# Patient Record
Sex: Female | Born: 1990 | Race: Black or African American | Hispanic: No | Smoking: Never smoker
Health system: Southern US, Community
[De-identification: ages and names within clinical notes are randomized; demographics above are authoritative.]

## PROBLEM LIST (undated history)

## (undated) DIAGNOSIS — F419 Anxiety disorder, unspecified: Secondary | ICD-10-CM

## (undated) DIAGNOSIS — Z789 Other specified health status: Secondary | ICD-10-CM

## (undated) DIAGNOSIS — O039 Complete or unspecified spontaneous abortion without complication: Secondary | ICD-10-CM

## (undated) HISTORY — DX: Other specified health status: Z78.9

## (undated) HISTORY — PX: NO PAST SURGERIES: SHX2092

## (undated) HISTORY — PX: OTHER SURGICAL HISTORY: SHX169

## (undated) HISTORY — DX: Complete or unspecified spontaneous abortion without complication: O03.9

## (undated) HISTORY — PX: DILATION AND CURETTAGE OF UTERUS: SHX78

---

## 2017-12-16 ENCOUNTER — Emergency Department
Admission: EM | Admit: 2017-12-16 | Discharge: 2017-12-16 | Disposition: A | Discharge status: Home / Self Care | Payer: Self-pay | Attending: Student in an Organized Health Care Education/Training Program | Admitting: Student in an Organized Health Care Education/Training Program

## 2017-12-16 ENCOUNTER — Encounter: Payer: Self-pay | Admitting: Emergency Medicine

## 2017-12-16 ENCOUNTER — Other Ambulatory Visit: Payer: Self-pay

## 2017-12-16 DIAGNOSIS — R519 Headache, unspecified: Secondary | ICD-10-CM

## 2017-12-16 DIAGNOSIS — R51 Headache: Secondary | ICD-10-CM | POA: Insufficient documentation

## 2017-12-16 LAB — CBC
HCT: 42.7 % (ref 35.0–47.0)
Hemoglobin: 13.9 g/dL (ref 12.0–16.0)
MCH: 28 pg (ref 26.0–34.0)
MCHC: 32.6 g/dL (ref 32.0–36.0)
MCV: 86 fL (ref 80.0–100.0)
PLATELETS: 282 10*3/uL (ref 150–440)
RBC: 4.96 MIL/uL (ref 3.80–5.20)
RDW: 13.6 % (ref 11.5–14.5)
WBC: 7.4 10*3/uL (ref 3.6–11.0)

## 2017-12-16 LAB — BASIC METABOLIC PANEL
Anion gap: 6 (ref 5–15)
BUN: 14 mg/dL (ref 6–20)
CALCIUM: 9.1 mg/dL (ref 8.9–10.3)
CO2: 27 mmol/L (ref 22–32)
CREATININE: 0.6 mg/dL (ref 0.44–1.00)
Chloride: 105 mmol/L (ref 98–111)
Glucose, Bld: 95 mg/dL (ref 70–99)
Potassium: 3.6 mmol/L (ref 3.5–5.1)
SODIUM: 138 mmol/L (ref 135–145)

## 2017-12-16 LAB — URINALYSIS, COMPLETE (UACMP) WITH MICROSCOPIC
Bacteria, UA: NONE SEEN
Bilirubin Urine: NEGATIVE
GLUCOSE, UA: NEGATIVE mg/dL
HGB URINE DIPSTICK: NEGATIVE
Ketones, ur: NEGATIVE mg/dL
Leukocytes, UA: NEGATIVE
NITRITE: NEGATIVE
Protein, ur: NEGATIVE mg/dL
SPECIFIC GRAVITY, URINE: 1.028 (ref 1.005–1.030)
pH: 6 (ref 5.0–8.0)

## 2017-12-16 LAB — POC URINE PREG, ED: Preg Test, Ur: NEGATIVE

## 2017-12-16 MED ORDER — PROCHLORPERAZINE EDISYLATE 10 MG/2ML IJ SOLN
10.0000 mg | Freq: Once | INTRAMUSCULAR | Status: AC
  Administered 2017-12-16: 10 mg via INTRAVENOUS
  Filled 2017-12-16: qty 2

## 2017-12-16 MED ORDER — DIPHENHYDRAMINE HCL 50 MG/ML IJ SOLN
12.5000 mg | Freq: Once | INTRAMUSCULAR | Status: AC
  Administered 2017-12-16: 12.5 mg via INTRAVENOUS
  Filled 2017-12-16: qty 1

## 2017-12-16 MED ORDER — SODIUM CHLORIDE 0.9 % IV BOLUS
1000.0000 mL | Freq: Once | INTRAVENOUS | Status: AC
  Administered 2017-12-16: 1000 mL via INTRAVENOUS

## 2017-12-16 NOTE — ED Triage Notes (Signed)
Pt states bad headache with some vomiting over the past few days, appears in NAD. Hx of migraines when she was younger.

## 2017-12-16 NOTE — Discharge Instructions (Signed)

## 2017-12-16 NOTE — ED Provider Notes (Signed)
[facility] Emergency Department Provider Note    First MD Initiated Contact with Patient 12/16/17 1519     (approximate)  I have reviewed the triage vital signs and the nursing notes.   HISTORY  Chief Complaint Headache    HPI Diana Kirby is a 27 y.o. female with no significant past medical history presents the ER with chief complaint of mild to moderate frontal headache.  Denies any previous history of headaches.  Denies any fevers.  No numbness or tingling.  Bright lights make the headache worse.  She is having difficulty sleeping at night due to persistent headache.  She is tried Motrin and Tylenol without any improvement.  States that she has had some associated nausea and feels dehydrated.  Is uncertain of pregnancy last menstrual cycle was the 10th of last month.  Denies any dysuria.  No chest pain.  No shortness of breath.    History reviewed. No pertinent past medical history. No family history on file. History reviewed. No pertinent surgical history. There are no active problems to display for this patient.     Prior to Admission medications   Not on File    Allergies Penicillins    Social History Social History   Tobacco Use  . Smoking status: Never Smoker  . Smokeless tobacco: Never Used  Substance Use Topics  . Alcohol use: Not Currently  . Drug use: Not on file    Review of Systems Patient denies headaches, rhinorrhea, blurry vision, numbness, shortness of breath, chest pain, edema, cough, abdominal pain, nausea, vomiting, diarrhea, dysuria, fevers, rashes or hallucinations unless otherwise stated above in HPI. ____________________________________________   PHYSICAL EXAM:  VITAL SIGNS: Vitals:   12/16/17 1440 12/16/17 1743  BP: 116/68 106/68  Pulse: 81 71  Resp: 16 18  Temp: 98.8 F (37.1 C)   SpO2: 99% 99%    Constitutional: Alert and oriented.  Eyes: Conjunctivae are normal.  Head: Atraumatic. Nose: No  congestion/rhinnorhea. Mouth/Throat: Mucous membranes are moist.   Neck: No stridor. Painless ROM.  Cardiovascular: Normal rate, regular rhythm. Grossly normal heart sounds.  Good peripheral circulation. Respiratory: Normal respiratory effort.  No retractions. Lungs CTAB. Gastrointestinal: Soft and nontender. No distention. No abdominal bruits. No CVA tenderness. Genitourinary:  Musculoskeletal: No lower extremity tenderness nor edema.  No joint effusions. Neurologic:  CN- intact.  No facial droop, Normal FNF.  Normal heel to shin.  Sensation intact bilaterally. Normal speech and language. No gross focal neurologic deficits are appreciated. No gait instability.  Skin:  Skin is warm, dry and intact. No rash noted. Psychiatric: Mood and affect are normal. Speech and behavior are normal.  ____________________________________________   LABS (all labs ordered are listed, but only abnormal results are displayed)  Results for orders placed or performed during the hospital encounter of 12/16/17 (from the past 24 hour(s))  CBC     Status: None   Collection Time: 12/16/17  2:45 PM  Result Value Ref Range   WBC 7.4 3.6 - 11.0 K/uL   RBC 4.96 3.80 - 5.20 MIL/uL   Hemoglobin 13.9 12.0 - 16.0 g/dL   HCT 54.6 56.8 - 12.7 %   MCV 86.0 80.0 - 100.0 fL   MCH 28.0 26.0 - 34.0 pg   MCHC 32.6 32.0 - 36.0 g/dL   RDW 51.7 00.1 - 74.9 %   Platelets 282 150 - 440 K/uL  Basic metabolic panel     Status: None   Collection Time: 12/16/17  2:45 PM  Result Value Ref Range   Sodium 138 135 - 145 mmol/L   Potassium 3.6 3.5 - 5.1 mmol/L   Chloride 105 98 - 111 mmol/L   CO2 27 22 - 32 mmol/L   Glucose, Bld 95 70 - 99 mg/dL   BUN 14 6 - 20 mg/dL   Creatinine, Ser 4.09 0.44 - 1.00 mg/dL   Calcium 9.1 8.9 - 81.1 mg/dL   GFR calc non Af Amer >60 >60 mL/min   GFR calc Af Amer >60 >60 mL/min   Anion gap 6 5 - 15  POC Urine Pregnancy, ED     Status: None   Collection Time: 12/16/17  5:15 PM  Result Value Ref  Range   Preg Test, Ur Negative Negative   ____________________________________________ ___________________________________  RADIOLOGY   ____________________________________________   PROCEDURES  Procedure(s) performed:  Procedures    Critical Care performed: no ____________________________________________   INITIAL IMPRESSION / ASSESSMENT AND PLAN / ED COURSE  Pertinent labs & imaging results that were available during my care of the patient were reviewed by me and considered in my medical decision making (see chart for details).   DDX: Migraine, tension, cluster, dehydration, SAH, meningitis  Diana Kirby is a 27 y.o. who presents to the ED with with HA for last several days. Not worst HA ever. Gradual onset.  Denies focal neurologic symptoms. Denies trauma. + photophobia. No fevers or neck pain. No vision loss. Afebrile in ED. VSS. Exam as above. No meningeal signs. No CN, motor, sensory or cerebellar deficits. Temporal arteries palpable and non-tender. Appears well and non-toxic.  Will provide IV fluids for hydration and IV medications for symptom control. Likely tension, non-specific or possible migraine HA. Clinical picture is not consistent with ICH, SAH, SDH, EDH, TIA, or CVA. No concern for meningitis or encephalitis. No concern for GCA/Temporal arteritis.     Clinical Course as of Dec 17 1746  [facility] Dec 16, 2017  1735 Patient reassessed.  Feels much improved.  Is requesting discharge home.  Urinalysis not returned at this point but denies any symptoms of UTI.  As she has reassuring neuro exam with a steady gait I do believe she stable and appropriate for outpatient follow-up.   [PR]    Clinical Course User Index [PR] Willy Eddy, MD     As part of my medical decision making, I reviewed the following data within the electronic MEDICAL RECORD NUMBER Nursing notes reviewed and incorporated, Labs reviewed, notes from prior ED  visits.   ____________________________________________   FINAL CLINICAL IMPRESSION(S) / ED DIAGNOSES  Final diagnoses:  Bad headache      NEW MEDICATIONS STARTED DURING THIS VISIT:  There are no discharge medications for this patient.    Note:  This document was prepared using Dragon voice recognition software and may include unintentional dictation errors.    Willy Eddy, MD 12/16/17 [phone]

## 2018-08-07 ENCOUNTER — Encounter: Payer: Self-pay | Admitting: Emergency Medicine

## 2018-08-07 ENCOUNTER — Emergency Department
Admission: EM | Admit: 2018-08-07 | Discharge: 2018-08-07 | Disposition: A | Discharge status: Home / Self Care | Payer: Self-pay | Attending: Emergency Medicine | Admitting: Emergency Medicine

## 2018-08-07 ENCOUNTER — Emergency Department: Payer: Self-pay

## 2018-08-07 ENCOUNTER — Other Ambulatory Visit: Payer: Self-pay

## 2018-08-07 DIAGNOSIS — R519 Headache, unspecified: Secondary | ICD-10-CM

## 2018-08-07 DIAGNOSIS — R51 Headache: Secondary | ICD-10-CM | POA: Insufficient documentation

## 2018-08-07 LAB — POCT PREGNANCY, URINE: Preg Test, Ur: NEGATIVE

## 2018-08-07 MED ORDER — DIPHENHYDRAMINE HCL 50 MG/ML IJ SOLN
50.0000 mg | Freq: Once | INTRAMUSCULAR | Status: AC
  Administered 2018-08-07: 50 mg via INTRAVENOUS
  Filled 2018-08-07: qty 1

## 2018-08-07 MED ORDER — FLUTICASONE PROPIONATE 50 MCG/ACT NA SUSP: 1.0000 | Freq: Two times a day (BID) | NASAL | 0 refills | Status: DC

## 2018-08-07 MED ORDER — BUTALBITAL-APAP-CAFFEINE 50-325-40 MG PO TABS: 1.0000 | ORAL_TABLET | Freq: Four times a day (QID) | ORAL | 0 refills | Status: DC | PRN

## 2018-08-07 MED ORDER — KETOROLAC TROMETHAMINE 30 MG/ML IJ SOLN
30.0000 mg | Freq: Once | INTRAMUSCULAR | Status: AC
  Administered 2018-08-07: 30 mg via INTRAVENOUS
  Filled 2018-08-07: qty 1

## 2018-08-07 MED ORDER — PROMETHAZINE HCL 25 MG/ML IJ SOLN
25.0000 mg | Freq: Once | INTRAMUSCULAR | Status: AC
  Administered 2018-08-07: 25 mg via INTRAVENOUS
  Filled 2018-08-07: qty 1

## 2018-08-07 NOTE — ED Provider Notes (Signed)
[facility] Emergency Dept Emergency Department Provider Note  ____________________________________________  Time seen: Approximately 4:49 PM  I have reviewed the triage vital signs and the nursing notes.   HISTORY  Chief Complaint Migraine    HPI Diana Kirby is a 28 y.o. female who presents the emergency department complaining of migraine x1 week.  Patient reports that she is experiencing a global headache.  She denies any trauma precipitating this.  She reports that it started off as a mild headache but has increased throughout the week.  She is now experiencing nausea as well.  No emesis.  Patient denies any URI symptoms.  Patient does have a history of recurrent headaches as a teenager but states that for the past couple years she only has a sporadic headache once in a while.  She is concerned as her migraine is never lasted a week.  She denies any auras.  No fevers or chills, chest pain, shortness of breath, abdominal pain, diarrhea or constipation.  Patient reports that she has a couple days late on her menstrual cycle.         History reviewed. No pertinent past medical history.  There are no active problems to display for this patient.   History reviewed. No pertinent surgical history.  Prior to Admission medications   Medication Sig Start Date End Date Taking? Authorizing Provider  butalbital-acetaminophen-caffeine (FIORICET) 50-325-40 MG tablet Take 1-2 tablets by mouth every 6 (six) hours as needed for headache. 08/07/18 08/07/19  , Delorise Royals, PA-C  fluticasone (FLONASE) 50 MCG/ACT nasal spray Place 1 spray into both nostrils 2 (two) times daily. 08/07/18   , Delorise Royals, PA-C    Allergies Penicillins  No family history on file.  Social History Social History   Tobacco Use  . Smoking status: Never Smoker  . Smokeless tobacco: Never Used  Substance Use Topics  . Alcohol use: Not Currently  . Drug use: Not on file     Review of  Systems  Constitutional: No fever/chills Eyes: No visual changes. No discharge ENT: No upper respiratory complaints. Cardiovascular: no chest pain. Respiratory: no cough. No SOB. Gastrointestinal: No abdominal pain.  Positive nausea, no vomiting.  No diarrhea.  No constipation. Genitourinary: No vaginal bleeding or discharge.  No dysuria, polyuria, hematuria.  Patient is several days late for her menstrual cycle. Musculoskeletal: Negative for musculoskeletal pain. Skin: Negative for rash, abrasions, lacerations, ecchymosis. Neurological: Positive for headache but denies focal weakness or numbness. 10-point ROS otherwise negative.  ____________________________________________   PHYSICAL EXAM:  VITAL SIGNS: ED Triage Vitals  Enc Vitals Group     BP 08/07/18 1639 120/72     Pulse Rate 08/07/18 1639 69     Resp 08/07/18 1639 18     Temp 08/07/18 1639 98.2 F (36.8 C)     Temp Source 08/07/18 1639 Oral     SpO2 08/07/18 1639 100 %     Weight 08/07/18 1635 170 lb (77.1 kg)     Height 08/07/18 1635  (1.6 m)     Head Circumference --      Peak Flow --      Pain Score 08/07/18 1635 10     Pain Loc --      Pain Edu? --      Excl. in GC? --      Constitutional: Alert and oriented. Well appearing and in no acute distress. Eyes: Conjunctivae are normal. PERRL. EOMI. Head: Atraumatic. ENT:      Ears: EACs unremarkable  bilaterally.  TMs are minimally bulging bilaterally.      Nose: No congestion/rhinnorhea.  Turbinates are boggy in nature.  Patient is tender to percussion over the frontal sinuses.  No tenderness to percussion over the sinuses.      Mouth/Throat: Mucous membranes are moist.  Oropharynx is nonerythematous and nonedematous.  Uvula is midline. Neck: No stridor.  No cervical spine tenderness to palpation.  Neck is supple full range of motion  Cardiovascular: Normal rate, regular rhythm. Normal S1 and S2.  Good peripheral circulation. Respiratory: Normal respiratory  effort without tachypnea or retractions. Lungs CTAB. Good air entry to the bases with no decreased or absent breath sounds. Gastrointestinal: Bowel sounds 4 quadrants. Soft and nontender to palpation. No guarding or rigidity. No palpable masses. No distention. No CVA tenderness. Musculoskeletal: Full range of motion to all extremities. No gross deformities appreciated. Neurologic:  Normal speech and language. No gross focal neurologic deficits are appreciated.  Cranial nerves II through XII grossly intact.  Negative Romberg's and pronator drift. Skin:  Skin is warm, dry and intact. No rash noted. Psychiatric: Mood and affect are normal. Speech and behavior are normal. Patient exhibits appropriate insight and judgement.   ____________________________________________   LABS (all labs ordered are listed, but only abnormal results are displayed)  Labs Reviewed  POC URINE PREG, ED  POCT PREGNANCY, URINE   ____________________________________________  EKG   ____________________________________________  RADIOLOGY I personally viewed and evaluated these images as part of my medical decision making, as well as reviewing the written report by the radiologist.  Ct Head Wo Contrast  Result Date: 08/07/2018 CLINICAL DATA:  1 week history of migraine headache. EXAM: CT HEAD WITHOUT CONTRAST TECHNIQUE: Contiguous axial images were obtained from the base of the skull through the vertex without intravenous contrast. COMPARISON:  None. FINDINGS: Brain: There is no evidence for acute hemorrhage, hydrocephalus, mass lesion, or abnormal extra-axial fluid collection. No definite CT evidence for acute infarction. Vascular: No hyperdense vessel or unexpected calcification. Skull: No evidence for fracture. No worrisome lytic or sclerotic lesion. Sinuses/Orbits: The visualized paranasal sinuses and mastoid air cells are clear. Visualized portions of the globes and intraorbital fat are unremarkable. Other: None.  IMPRESSION: Normal exam. Electronically Signed   By: Kennith CenterEric  Mansell M.D.   On: 08/07/2018 17:31    ____________________________________________    PROCEDURES  Procedure(s) performed:    Procedures    Medications  promethazine (PHENERGAN) injection 25 mg (25 mg Intravenous Given 08/07/18 1751)  ketorolac (TORADOL) 30 MG/ML injection 30 mg (30 mg Intravenous Given 08/07/18 1749)  diphenhydrAMINE (BENADRYL) injection 50 mg (50 mg Intravenous Given 08/07/18 1749)     ____________________________________________   INITIAL IMPRESSION / ASSESSMENT AND PLAN / ED COURSE  Pertinent labs & imaging results that were available during my care of the patient were reviewed by me and considered in my medical decision making (see chart for details).  Review of the [place] CSRS was performed in accordance of the [facility] prior to dispensing any controlled drugs.  Clinical Course as of Aug 06 1812  Thu Aug 07, 2018  1712 Patient presents emergency department complaining of 7 days of worsening headache.  Patient reports that she had a history of headaches as a teenager but has had none recently.  Patient reports that this headache is different from any other headache she is ever experienced.  This is both more severe, accompanied with nausea, photophobia and phonophobia.  Patient did have some mild boggy turbinates and tender percussion as  well as several days late on her menstrual cycle.  Differential includes migraine headache, sinus headache, pregnancy, cranial mass, meningitis.   [JC]    Clinical Course User Index [JC] , Delorise Royals, PA-C          Patient's diagnosis is consistent with bad headache.  Patient presents the emergency department complaining of a headache x7 days.  Overall exam is reassuring.  Patient did have some mild findings consistent with allergic rhinitis.  Patient is given migraine cocktail emergency department, Flonase and Fioricet for at home use if headache returns.   Follow-up primary care..Patient is given ED precautions to return to the ED for any worsening or new symptoms.     ____________________________________________  FINAL CLINICAL IMPRESSION(S) / ED DIAGNOSES  Final diagnoses:  Bad headache      NEW MEDICATIONS STARTED DURING THIS VISIT:  ED Discharge Orders         Ordered    fluticasone (FLONASE) 50 MCG/ACT nasal spray  2 times daily     08/07/18 1748    butalbital-acetaminophen-caffeine (FIORICET) 50-325-40 MG tablet  Every 6 hours PRN     08/07/18 1748              This chart was dictated using voice recognition software/Dragon. Despite best efforts to proofread, errors can occur which can change the meaning. Any change was purely unintentional.    Racheal Patches, PA-C 08/07/18 1814    Nita Sickle, MD 08/07/18 2255

## 2018-08-07 NOTE — ED Notes (Signed)
Md at bedside awaiting plan of care.

## 2018-08-07 NOTE — ED Notes (Signed)
Reports headache for 1 week, nothing over the counter helps, unable to go to work today due to headache. Also states photophobia and nausea, has not had any episodes of vomiting.

## 2018-08-07 NOTE — ED Triage Notes (Signed)
Patient presents to the ED with migraine x 1 week.  Patient states today she also feels nauseous.  Patient reports history of migraines, years ago.

## 2018-12-29 ENCOUNTER — Other Ambulatory Visit: Payer: Self-pay

## 2018-12-29 ENCOUNTER — Encounter: Payer: Self-pay | Admitting: Emergency Medicine

## 2018-12-29 ENCOUNTER — Emergency Department
Admission: EM | Admit: 2018-12-29 | Discharge: 2018-12-29 | Disposition: A | Discharge status: Home / Self Care | Payer: Medicaid Other | Attending: Student in an Organized Health Care Education/Training Program | Admitting: Student in an Organized Health Care Education/Training Program

## 2018-12-29 DIAGNOSIS — R110 Nausea: Secondary | ICD-10-CM | POA: Insufficient documentation

## 2018-12-29 DIAGNOSIS — R51 Headache: Secondary | ICD-10-CM | POA: Insufficient documentation

## 2018-12-29 DIAGNOSIS — R519 Headache, unspecified: Secondary | ICD-10-CM

## 2018-12-29 LAB — CBC WITH DIFFERENTIAL/PLATELET
Abs Immature Granulocytes: 0.02 10*3/uL (ref 0.00–0.07)
Basophils Absolute: 0 10*3/uL (ref 0.0–0.1)
Basophils Relative: 0 %
Eosinophils Absolute: 0 10*3/uL (ref 0.0–0.5)
Eosinophils Relative: 0 %
HCT: 42.8 % (ref 36.0–46.0)
Hemoglobin: 13.8 g/dL (ref 12.0–15.0)
Immature Granulocytes: 0 %
Lymphocytes Relative: 27 %
Lymphs Abs: 2.9 10*3/uL (ref 0.7–4.0)
MCH: 27.3 pg (ref 26.0–34.0)
MCHC: 32.2 g/dL (ref 30.0–36.0)
MCV: 84.8 fL (ref 80.0–100.0)
Monocytes Absolute: 0.5 10*3/uL (ref 0.1–1.0)
Monocytes Relative: 5 %
Neutro Abs: 7 10*3/uL (ref 1.7–7.7)
Neutrophils Relative %: 68 %
Platelets: 311 10*3/uL (ref 150–400)
RBC: 5.05 MIL/uL (ref 3.87–5.11)
RDW: 13.5 % (ref 11.5–15.5)
WBC: 10.4 10*3/uL (ref 4.0–10.5)
nRBC: 0 % (ref 0.0–0.2)

## 2018-12-29 LAB — URINALYSIS, COMPLETE (UACMP) WITH MICROSCOPIC
Bilirubin Urine: NEGATIVE
Glucose, UA: NEGATIVE mg/dL
Ketones, ur: NEGATIVE mg/dL
Leukocytes,Ua: NEGATIVE
Nitrite: NEGATIVE
Protein, ur: NEGATIVE mg/dL
Specific Gravity, Urine: 1.008 (ref 1.005–1.030)
pH: 7 (ref 5.0–8.0)

## 2018-12-29 LAB — COMPREHENSIVE METABOLIC PANEL
ALT: 72 U/L — ABNORMAL HIGH (ref 0–44)
AST: 37 U/L (ref 15–41)
Albumin: 4.5 g/dL (ref 3.5–5.0)
Alkaline Phosphatase: 58 U/L (ref 38–126)
Anion gap: 9 (ref 5–15)
BUN: 12 mg/dL (ref 6–20)
CO2: 25 mmol/L (ref 22–32)
Calcium: 9.4 mg/dL (ref 8.9–10.3)
Chloride: 103 mmol/L (ref 98–111)
Creatinine, Ser: 0.52 mg/dL (ref 0.44–1.00)
GFR calc Af Amer: >60 mL/min (ref >60)
GFR calc non Af Amer: >60 mL/min (ref >60)
Glucose, Bld: 111 mg/dL — ABNORMAL HIGH (ref 70–99)
Potassium: 3.8 mmol/L (ref 3.5–5.1)
Sodium: 137 mmol/L (ref 135–145)
Total Bilirubin: 1.6 mg/dL — ABNORMAL HIGH (ref 0.3–1.2)
Total Protein: 7.7 g/dL (ref 6.5–8.1)

## 2018-12-29 LAB — POCT PREGNANCY, URINE: Preg Test, Ur: NEGATIVE

## 2018-12-29 LAB — LIPASE, BLOOD: Lipase: 27 U/L (ref 11–51)

## 2018-12-29 MED ORDER — SODIUM CHLORIDE 0.9 % IV BOLUS
1000.0000 mL | Freq: Once | INTRAVENOUS | Status: AC
  Administered 2018-12-29: 1000 mL via INTRAVENOUS

## 2018-12-29 MED ORDER — PROCHLORPERAZINE EDISYLATE 10 MG/2ML IJ SOLN
10.0000 mg | Freq: Once | INTRAMUSCULAR | Status: AC
  Administered 2018-12-29: 08:00:00 10 mg via INTRAVENOUS
  Filled 2018-12-29: qty 2

## 2018-12-29 MED ORDER — PROMETHAZINE HCL 25 MG/ML IJ SOLN: 12.5000 mg | Freq: Four times a day (QID) | INTRAMUSCULAR | Status: DC | PRN

## 2018-12-29 MED ORDER — DIPHENHYDRAMINE HCL 50 MG/ML IJ SOLN
12.5000 mg | Freq: Once | INTRAMUSCULAR | Status: AC
  Administered 2018-12-29: 12.5 mg via INTRAVENOUS
  Filled 2018-12-29: qty 1

## 2018-12-29 MED ORDER — KETOROLAC TROMETHAMINE 30 MG/ML IJ SOLN
15.0000 mg | Freq: Once | INTRAMUSCULAR | Status: AC
  Administered 2018-12-29: 15 mg via INTRAVENOUS
  Filled 2018-12-29: qty 1

## 2018-12-29 MED ORDER — BUTALBITAL-APAP-CAFFEINE 50-325-40 MG PO TABS: 1.0000 | ORAL_TABLET | Freq: Four times a day (QID) | ORAL | 0 refills | Status: DC | PRN

## 2018-12-29 NOTE — Discharge Instructions (Signed)

## 2018-12-29 NOTE — ED Notes (Signed)
Lab at bedside

## 2018-12-29 NOTE — ED Notes (Signed)
Lab called to state that the green top had hemolyzed - requested that the lab come and recollect sample

## 2018-12-29 NOTE — ED Notes (Signed)
Pt actively vomiting.

## 2018-12-29 NOTE — ED Notes (Signed)
Pt resting quietly with eyes closed - no further vomiting at this time

## 2018-12-29 NOTE — ED Triage Notes (Signed)
Pt reports frontal headache for about a week; pain is throbbing and constant; also reports intermittent nausea; pt says she has had unprotected intercourse since her last menstrual cycle; pt awake and alert; talking in complete sentences; pt says she has taken a tylenol with no relief;

## 2018-12-29 NOTE — ED Provider Notes (Signed)
[facility] Emergency Department Provider Note    First MD Initiated Contact with Patient 12/29/18 [phone]     (approximate)  I have reviewed the triage vital signs and the nursing notes.   HISTORY  Chief Complaint Headache and Nausea    HPI Diana Kirby is a 28 y.o. female who presents to the ER for evaluation of 1 week of intermittent frontal headache associated with nausea and vomiting.  Does have a history of headaches has not seen a neurologist for this.  Has not had any measured fevers.  No chest pain or shortness of breath.  Per triage note there was concern for some discharge but my questioning she denies any symptoms related to that or concerns.  Denies any dysuria.     History reviewed. No pertinent past medical history. History reviewed. No pertinent family history. History reviewed. No pertinent surgical history. There are no active problems to display for this patient.     Prior to Admission medications   Medication Sig Start Date End Date Taking? Authorizing Provider  butalbital-acetaminophen-caffeine (FIORICET) [phone] MG tablet Take 1 tablet by mouth every 6 (six) hours as needed for headache. 12/29/18 12/29/19  Willy Eddy, MD    Allergies Penicillins    Social History Social History   Tobacco Use  . Smoking status: Never Smoker  . Smokeless tobacco: Never Used  Substance Use Topics  . Alcohol use: Not Currently  . Drug use: Never    Review of Systems Patient denies headaches, rhinorrhea, blurry vision, numbness, shortness of breath, chest pain, edema, cough, abdominal pain, nausea, vomiting, diarrhea, dysuria, fevers, rashes or hallucinations unless otherwise stated above in HPI. ____________________________________________   PHYSICAL EXAM:  VITAL SIGNS: Vitals:   12/29/18 0625 12/29/18 0835  BP: 116/71 128/70  Pulse: (!) 103 100  Resp: 16 15  Temp: 99.1 F (37.3 C)   SpO2: 99% 98%    Constitutional:  Alert and oriented.  Eyes: Conjunctivae are normal.  Head: Atraumatic. Nose: No congestion/rhinnorhea. Mouth/Throat: Mucous membranes are moist.   Neck: No stridor. Painless ROM.  Cardiovascular: Normal rate, regular rhythm. Grossly normal heart sounds.  Good peripheral circulation. Respiratory: Normal respiratory effort.  No retractions. Lungs CTAB. Gastrointestinal: Soft and nontender. No distention. No abdominal bruits. No CVA tenderness. Genitourinary: deferred Musculoskeletal: No lower extremity tenderness nor edema.  No joint effusions. Neurologic:  Normal speech and language. No gross focal neurologic deficits are appreciated. No facial droop Skin:  Skin is warm, dry and intact. No rash noted. Psychiatric: Mood and affect are normal. Speech and behavior are normal.  ____________________________________________   LABS (all labs ordered are listed, but only abnormal results are displayed)  Results for orders placed or performed during the hospital encounter of 12/29/18 (from the past 24 hour(s))  CBC with Differential/Platelet     Status: None   Collection Time: 12/29/18  8:07 AM  Result Value Ref Range   WBC 10.4 4.0 - 10.5 K/uL   RBC 5.05 3.87 - 5.11 MIL/uL   Hemoglobin 13.8 12.0 - 15.0 g/dL   HCT 83.2 91.9 - 16.6 %   MCV 84.8 80.0 - 100.0 fL   MCH 27.3 26.0 - 34.0 pg   MCHC 32.2 30.0 - 36.0 g/dL   RDW 06.0 04.5 - 99.7 %   Platelets 311 150 - 400 K/uL   nRBC 0.0 0.0 - 0.2 %   Neutrophils Relative % 68 %   Neutro Abs 7.0 1.7 - 7.7 K/uL   Lymphocytes Relative  27 %   Lymphs Abs 2.9 0.7 - 4.0 K/uL   Monocytes Relative 5 %   Monocytes Absolute 0.5 0.1 - 1.0 K/uL   Eosinophils Relative 0 %   Eosinophils Absolute 0.0 0.0 - 0.5 K/uL   Basophils Relative 0 %   Basophils Absolute 0.0 0.0 - 0.1 K/uL   Immature Granulocytes 0 %   Abs Immature Granulocytes 0.02 0.00 - 0.07 K/uL  Comprehensive metabolic panel     Status: Abnormal   Collection Time: 12/29/18  8:42 AM  Result  Value Ref Range   Sodium 137 135 - 145 mmol/L   Potassium 3.8 3.5 - 5.1 mmol/L   Chloride 103 98 - 111 mmol/L   CO2 25 22 - 32 mmol/L   Glucose, Bld 111 (H) 70 - 99 mg/dL   BUN 12 6 - 20 mg/dL   Creatinine, Ser 0.52 0.44 - 1.00 mg/dL   Calcium 9.4 8.9 - 10.3 mg/dL   Total Protein 7.7 6.5 - 8.1 g/dL   Albumin 4.5 3.5 - 5.0 g/dL   AST 37 15 - 41 U/L   ALT 72 (H) 0 - 44 U/L   Alkaline Phosphatase 58 38 - 126 U/L   Total Bilirubin 1.6 (H) 0.3 - 1.2 mg/dL   GFR calc non Af Amer >60 >60 mL/min   GFR calc Af Amer >60 >60 mL/min   Anion gap 9 5 - 15  Lipase, blood     Status: None   Collection Time: 12/29/18  8:42 AM  Result Value Ref Range   Lipase 27 11 - 51 U/L  Urinalysis, Complete w Microscopic     Status: Abnormal   Collection Time: 12/29/18  9:24 AM  Result Value Ref Range   Color, Urine YELLOW (A) YELLOW   APPearance CLOUDY (A) CLEAR   Specific Gravity, Urine 1.008 1.005 - 1.030   pH 7.0 5.0 - 8.0   Glucose, UA NEGATIVE NEGATIVE mg/dL   Hgb urine dipstick SMALL (A) NEGATIVE   Bilirubin Urine NEGATIVE NEGATIVE   Ketones, ur NEGATIVE NEGATIVE mg/dL   Protein, ur NEGATIVE NEGATIVE mg/dL   Nitrite NEGATIVE NEGATIVE   Leukocytes,Ua NEGATIVE NEGATIVE   RBC / HPF 0-5 0 - 5 RBC/hpf   WBC, UA 0-5 0 - 5 WBC/hpf   Bacteria, UA RARE (A) NONE SEEN   Squamous Epithelial / LPF 11-20 0 - 5   Mucus PRESENT   Pregnancy, urine POC     Status: None   Collection Time: 12/29/18  9:27 AM  Result Value Ref Range   Preg Test, Ur NEGATIVE NEGATIVE   ____________________________________________ ____________________________________________  RADIOLOGY   ____________________________________________   PROCEDURES  Procedure(s) performed:  Procedures    Critical Care performed: no ____________________________________________   INITIAL IMPRESSION / ASSESSMENT AND PLAN / ED COURSE  Pertinent labs & imaging results that were available during my care of the patient were reviewed by me  and considered in my medical decision making (see chart for details).   DDX: migraine, tension, enteritis, dehydration, unlikely meningitis or encephalitis  Diana Kirby is a 28 y.o. who presents to the ED with symptoms as described above.  Patient well-appearing.  Is having some nausea.  Abdominal exam is soft benign.  He does have a history of migraine headaches.  Will give migraine cocktail and reassess.  A lower suspicion for infectious etiology.  The recommend COVID test which she has declined.  She not having a chest pain or shortness of breath.  Clinical Course as of  Dec 29 1042  Mon Dec 29, 2018  1037 Patient feels improved.  She is requesting discharge home.  Able to ambulate steady gait.  No persistent nausea or vomiting.  Blood work is reassuring.  This not consistent encephalitis or meningitis.  Have discussed with the patient and available family all diagnostics and treatments performed thus far and all questions were answered to the best of my ability. The patient demonstrates understanding and agreement with plan.    [PR]    Clinical Course User Index [PR] Willy Eddyobinson, Elfa Wooton, MD    The patient was evaluated in Emergency Department today for the symptoms described in the history of present illness. He/she was evaluated in the context of the global COVID-19 pandemic, which necessitated consideration that the patient might be at risk for infection with the SARS-CoV-2 virus that causes COVID-19. Institutional protocols and algorithms that pertain to the evaluation of patients at risk for COVID-19 are in a state of rapid change based on information released by regulatory bodies including the CDC and federal and state organizations. These policies and algorithms were followed during the patient's care in the ED.  As part of my medical decision making, I reviewed the following data within the electronic MEDICAL RECORD NUMBER Nursing notes reviewed and incorporated, Labs reviewed, notes from  prior ED visits and [place] Controlled Substance Database   ____________________________________________   FINAL CLINICAL IMPRESSION(S) / ED DIAGNOSES  Final diagnoses:  Bad headache  Nausea      NEW MEDICATIONS STARTED DURING THIS VISIT:  New Prescriptions   BUTALBITAL-ACETAMINOPHEN-CAFFEINE (FIORICET) [phone] MG TABLET    Take 1 tablet by mouth every 6 (six) hours as needed for headache.     Note:  This document was prepared using Dragon voice recognition software and may include unintentional dictation errors.    Willy Eddyobinson, Glynis Hunsucker, MD 12/29/18 1044

## 2018-12-29 NOTE — ED Notes (Signed)
Pt reports that she is feeling better and would like to go home and "get some sleep"

## 2019-04-10 NOTE — L&D Delivery Note (Signed)
       Delivery Note   Diana Kirby is a 29 y.o. G3P2002 at [redacted]w[redacted]d Estimated Date of Delivery: 02/10/20  PRE-OPERATIVE DIAGNOSIS:  1) [redacted]w[redacted]d pregnancy.  2) Labor  POST-OPERATIVE DIAGNOSIS:  1) [redacted]w[redacted]d pregnancy s/p Vaginal, Spontaneous  2) Same with viable infant  Delivery Type: Vaginal, Spontaneous    Delivery Anesthesia: Epidural   Labor Complications:      ESTIMATED BLOOD LOSS: 175  ml    FINDINGS:   1) female infant, Apgar scores of 8   at 1 minute and 9   at 5 minutes and a birthweight of   ounces.    2) Nuchal cord: Yes (tight)  SPECIMENS:   PLACENTA:   Appearance: Intact    Removal: Spontaneous      Disposition:    DISPOSITION:  Infant to left in stable condition in the delivery room, with L&D personnel and mother,  NARRATIVE SUMMARY: Labor course:  [name] is a [id number] at [redacted]w[redacted]d who presented for labor management.  She progressed well in labor without pitocin.  She received the appropriate anesthesia and proceeded to complete dilation. She evidenced good maternal expulsive effort during the second stage. She went on to deliver a viable infant. The placenta delivered without problems and was noted to be complete. A perineal and vaginal examination was performed. Episiotomy/Lacerations: Labial  - very superficial   Elonda Husky, M.D. 02/09/2020 7:02 PM

## 2019-06-18 ENCOUNTER — Emergency Department: Payer: Medicaid Other

## 2019-06-18 ENCOUNTER — Other Ambulatory Visit: Payer: Self-pay

## 2019-06-18 ENCOUNTER — Emergency Department
Admission: EM | Admit: 2019-06-18 | Discharge: 2019-06-18 | Disposition: A | Discharge status: Home / Self Care | Payer: Medicaid Other | Attending: Student | Admitting: Student

## 2019-06-18 ENCOUNTER — Encounter: Payer: Self-pay | Admitting: Emergency Medicine

## 2019-06-18 DIAGNOSIS — O219 Vomiting of pregnancy, unspecified: Secondary | ICD-10-CM | POA: Diagnosis not present

## 2019-06-18 DIAGNOSIS — O210 Mild hyperemesis gravidarum: Secondary | ICD-10-CM

## 2019-06-18 DIAGNOSIS — Z3A01 Less than 8 weeks gestation of pregnancy: Secondary | ICD-10-CM | POA: Insufficient documentation

## 2019-06-18 DIAGNOSIS — R1110 Vomiting, unspecified: Secondary | ICD-10-CM

## 2019-06-18 LAB — COMPREHENSIVE METABOLIC PANEL
ALT: 57 U/L — ABNORMAL HIGH (ref 0–44)
AST: 35 U/L (ref 15–41)
Albumin: 4.3 g/dL (ref 3.5–5.0)
Alkaline Phosphatase: 47 U/L (ref 38–126)
Anion gap: 8 (ref 5–15)
BUN: 13 mg/dL (ref 6–20)
CO2: 25 mmol/L (ref 22–32)
Calcium: 9.3 mg/dL (ref 8.9–10.3)
Chloride: 103 mmol/L (ref 98–111)
Creatinine, Ser: 0.5 mg/dL (ref 0.44–1.00)
GFR calc Af Amer: >60 mL/min (ref >60)
GFR calc non Af Amer: >60 mL/min (ref >60)
Glucose, Bld: 114 mg/dL — ABNORMAL HIGH (ref 70–99)
Potassium: 3.7 mmol/L (ref 3.5–5.1)
Sodium: 136 mmol/L (ref 135–145)
Total Bilirubin: 0.9 mg/dL (ref 0.3–1.2)
Total Protein: 7.6 g/dL (ref 6.5–8.1)

## 2019-06-18 LAB — URINALYSIS, COMPLETE (UACMP) WITH MICROSCOPIC
Bacteria, UA: NONE SEEN
Bilirubin Urine: NEGATIVE
Glucose, UA: NEGATIVE mg/dL
Hgb urine dipstick: NEGATIVE
Ketones, ur: 20 mg/dL — AB
Leukocytes,Ua: NEGATIVE
Nitrite: NEGATIVE
Protein, ur: NEGATIVE mg/dL
Specific Gravity, Urine: 1.024 (ref 1.005–1.030)
pH: 5 (ref 5.0–8.0)

## 2019-06-18 LAB — CBC
HCT: 40.8 % (ref 36.0–46.0)
Hemoglobin: 13.1 g/dL (ref 12.0–15.0)
MCH: 27.3 pg (ref 26.0–34.0)
MCHC: 32.1 g/dL (ref 30.0–36.0)
MCV: 85 fL (ref 80.0–100.0)
Platelets: 290 10*3/uL (ref 150–400)
RBC: 4.8 MIL/uL (ref 3.87–5.11)
RDW: 13.2 % (ref 11.5–15.5)
WBC: 9.8 10*3/uL (ref 4.0–10.5)
nRBC: 0 % (ref 0.0–0.2)

## 2019-06-18 LAB — HCG, QUANTITATIVE, PREGNANCY: hCG, Beta Chain, Quant, S: 62702 m[IU]/mL — ABNORMAL HIGH (ref <5)

## 2019-06-18 MED ORDER — DOXYLAMINE SUCCINATE (SLEEP) 25 MG PO TABS: 12.5000 mg | ORAL_TABLET | Freq: Three times a day (TID) | ORAL | 0 refills | Status: DC

## 2019-06-18 MED ORDER — ACETAMINOPHEN 325 MG PO TABS
650.0000 mg | ORAL_TABLET | Freq: Once | ORAL | Status: AC
  Administered 2019-06-18: 650 mg via ORAL
  Filled 2019-06-18: qty 2

## 2019-06-18 MED ORDER — ONDANSETRON HCL 4 MG/2ML IJ SOLN
4.0000 mg | Freq: Once | INTRAMUSCULAR | Status: AC
  Administered 2019-06-18: 4 mg via INTRAVENOUS
  Filled 2019-06-18: qty 2

## 2019-06-18 MED ORDER — DOCUSATE SODIUM 100 MG PO CAPS: 100.0000 mg | ORAL_CAPSULE | Freq: Two times a day (BID) | ORAL | 0 refills | Status: AC | PRN

## 2019-06-18 MED ORDER — VITAMIN B-6 25 MG PO TABS: 25.0000 mg | ORAL_TABLET | Freq: Three times a day (TID) | ORAL | 0 refills | Status: AC

## 2019-06-18 MED ORDER — SODIUM CHLORIDE 0.9 % IV BOLUS
1000.0000 mL | Freq: Once | INTRAVENOUS | Status: AC
  Administered 2019-06-18: 1000 mL via INTRAVENOUS

## 2019-06-18 NOTE — Discharge Instructions (Addendum)
Thank you for letting us take care of you in the emergency department today.   Please continue to take any regular, prescribed medications.   New medications we have prescribed:  Combination of B6 [pyridoxine] and doxylamine [Unisom]. The doxylamine portion can make you a bit drowsy. Docusate - for constipation  Please follow up with: Your OBGYN doctor to review your ER visit and follow up on your symptoms.    Please return to the ER for any new or worsening symptoms.

## 2019-06-18 NOTE — ED Provider Notes (Signed)
[facility] Emergency Department Provider Note  ____________________________________________   First MD Initiated Contact with Patient 06/18/19 [phone]     (approximate)  I have reviewed the triage vital signs and the nursing notes.   HISTORY  Chief Complaint Emesis   HPI Diana Kirby is a 29 y.o. female   G3 P2-0-0-2 presents emergency department secondary to 1 week history of nonbloody emesis.  Patient denies any abdominal pain no vaginal bleeding.  Patient states that she took a home pregnancy test on Friday which was positive.  Patient states that she had considerable morning sickness with her first pregnancy none with the second.       No past medical history on file.  There are no problems to display for this patient.   No past surgical history on file.  Prior to Admission medications   Medication Sig Start Date End Date Taking? Authorizing Provider  butalbital-acetaminophen-caffeine (FIORICET) [phone] MG tablet Take 1 tablet by mouth every 6 (six) hours as needed for headache. 12/29/18 12/29/19  Merlyn Lot, MD    Allergies Penicillins  No family history on file.  Social History Social History   Tobacco Use  . Smoking status: Never Smoker  . Smokeless tobacco: Never Used  Substance Use Topics  . Alcohol use: Not Currently  . Drug use: Never    Review of Systems Constitutional: No fever/chills Eyes: No visual changes. ENT: No sore throat. Cardiovascular: Denies chest pain. Respiratory: Denies shortness of breath. Gastrointestinal: No abdominal pain.  No positive for nausea and vomiting.  No diarrhea.  No constipation. Genitourinary: Negative for dysuria. Musculoskeletal: Negative for neck pain.  Negative for back pain. Integumentary: Negative for rash. Neurological: Negative for headaches, focal weakness or numbness.  ____________________________________________   PHYSICAL EXAM:  VITAL SIGNS: ED Triage Vitals    Enc Vitals Group     BP 06/18/19 0250 (!) 107/56     Pulse Rate 06/18/19 0250 83     Resp 06/18/19 0250 18     Temp 06/18/19 0250 99 F (37.2 C)     Temp Source 06/18/19 0250 Oral     SpO2 06/18/19 0250 100 %     Weight 06/18/19 0251 81.6 kg (180 lb)     Height 06/18/19 0251 1.6 m (5\' 3" )     Head Circumference --      Peak Flow --      Pain Score 06/18/19 0258 0     Pain Loc --      Pain Edu? --      Excl. in [place]? --     Constitutional: Alert and oriented.  Eyes: Conjunctivae are normal.  Head: Atraumatic. Mouth/Throat: Patient is wearing a mask. Neck: No stridor.  No meningeal signs.   Cardiovascular: Normal rate, regular rhythm. Good peripheral circulation. Grossly normal heart sounds. Respiratory: Normal respiratory effort.  No retractions. Gastrointestinal: Soft and nontender. No distention.  Musculoskeletal: No lower extremity tenderness nor edema. No gross deformities of extremities. Neurologic:  Normal speech and language. No gross focal neurologic deficits are appreciated.  Skin:  Skin is warm, dry and intact. Psychiatric: Mood and affect are normal. Speech and behavior are normal.  ____________________________________________   LABS (all labs ordered are listed, but only abnormal results are displayed)  Labs Reviewed  COMPREHENSIVE METABOLIC PANEL - Abnormal; Notable for the following components:      Result Value   Glucose, Bld 114 (*)    ALT 57 (*)    All other components within  normal limits  HCG, QUANTITATIVE, PREGNANCY - Abnormal; Notable for the following components:   hCG, Beta Chain, Quant, S 62,702 (*)    All other components within normal limits  CBC  URINALYSIS, COMPLETE (UACMP) WITH MICROSCOPIC  POC URINE PREG, ED   ___________  RADIOLOGY I, [place] N Gini Caputo, personally viewed and evaluated these images (plain radiographs) as part of my medical decision making, as well as reviewing the written report by the radiologist.  ED MD  interpretation:    Official radiology report(s): US OB LESS THAN 14 WEEKS WITH OB TRANSVAGINAL  Result Date: 06/18/2019 CLINICAL DATA:  Pelvic pain and vomiting.  Positive pregnancy test. EXAM: OBSTETRIC <14 WK [place] AND TRANSVAGINAL OB US TECHNIQUE: Both transabdominal and transvaginal ultrasound examinations were performed for complete evaluation of the gestation as well as the maternal uterus, adnexal regions, and pelvic cul-de-sac. Transvaginal technique was performed to assess early pregnancy. COMPARISON:  None. FINDINGS: Intrauterine gestational sac: Present Yolk sac:  Present Embryo:  Present Cardiac Activity: Present Heart Rate: 110 bpm MSD:   mm    w     d CRL:  6.5 mm   6 w   3 d                  [place] EDC: 02/08/2020 Subchorionic hemorrhage:  None visualized. Maternal uterus/adnexae: Normal ovaries. Small amount of free pelvic fluid. IMPRESSION: 1. Single living intrauterine embryo estimated at 6 weeks and 3 days gestation. 2. Normal ovaries. 3. Small amount of free pelvic fluid. Electronically Signed   By: Rudie Meyer M.D.   On: 06/18/2019 07:13    ____________________________________________   PROCEDURES   Procedure(s) performed (including Critical Care):  Procedures   ____________________________________________   INITIAL IMPRESSION / MDM / ASSESSMENT AND PLAN / ED COURSE  As part of my medical decision making, I reviewed the following data within the electronic MEDICAL RECORD NUMBER   29 year old female presented with above-stated history and physical exam consistent with morning sickness.  Patient received 2 L IV normal saline with improvement of symptoms awaiting ultrasound results with anticipation for discharge home with antiemetic.  Patient's care transferred to Dr. Colon Branch       ____________________________________________  FINAL CLINICAL IMPRESSION(S) / ED DIAGNOSES  Final diagnoses:  Morning sickness  Vomiting affecting pregnancy     MEDICATIONS GIVEN DURING THIS  VISIT:  Medications  sodium chloride 0.9 % bolus 1,000 mL (0 mLs Intravenous Stopped 06/18/19 0713)  sodium chloride 0.9 % bolus 1,000 mL (0 mLs Intravenous Stopped 06/18/19 0713)  ondansetron (ZOFRAN) injection 4 mg (4 mg Intravenous Given 06/18/19 0423)  acetaminophen (TYLENOL) tablet 650 mg (650 mg Oral Given 06/18/19 0540)     ED Discharge Orders    None      *Please note:  Diana Kirby was evaluated in Emergency Department on 06/18/2019 for the symptoms described in the history of present illness. She was evaluated in the context of the global COVID-19 pandemic, which necessitated consideration that the patient might be at risk for infection with the SARS-CoV-2 virus that causes COVID-19. Institutional protocols and algorithms that pertain to the evaluation of patients at risk for COVID-19 are in a state of rapid change based on information released by regulatory bodies including the CDC and federal and state organizations. These policies and algorithms were followed during the patient's care in the ED.  Some ED evaluations and interventions may be delayed as a result of limited staffing during the pandemic.*  Note:  This document  was prepared using Conservation officer, historic buildings and may include unintentional dictation errors.   Darci Current, MD 06/18/19 [phone]

## 2019-06-18 NOTE — ED Notes (Signed)
ED Provider at bedside. 

## 2019-06-18 NOTE — ED Provider Notes (Signed)
[place]: IMPRESSION:  1. Single living intrauterine embryo estimated at 6 weeks and 3 days gestation.  2. Normal ovaries.  3. Small amount of free pelvic fluid.   [place] as above, measures c/w dating based on LMP (05/06/19). UA negative for infection, no bacteruria.   Patient already has a new OB appointment established for Friday.  She has been able to tolerate PO in the ED and feels comfortable discharge. Will initiate on B6 and doxylamine for n/v of pregnancy, as well as docusate as she has been experiencing increased constipation recently.  Patient voices understanding and is comfortable with the plan.  Given return precautions.   Miguel Aschoff., MD 06/18/19 [phone]

## 2019-06-18 NOTE — ED Triage Notes (Signed)
Pt here for vomiting for a week, no abd or diarrhea. Had a positive preg test Friday.

## 2019-06-19 ENCOUNTER — Encounter: Payer: Self-pay | Admitting: Obstetrics and Gynecology

## 2019-06-19 ENCOUNTER — Ambulatory Visit (INDEPENDENT_AMBULATORY_CARE_PROVIDER_SITE_OTHER): Payer: Self-pay | Admitting: Obstetrics and Gynecology

## 2019-06-19 VITALS — BP 104/72 | HR 80 | Ht 63.0 in | Wt 207.4 lb

## 2019-06-19 DIAGNOSIS — Z3A08 8 weeks gestation of pregnancy: Secondary | ICD-10-CM

## 2019-06-19 DIAGNOSIS — R112 Nausea with vomiting, unspecified: Secondary | ICD-10-CM

## 2019-06-19 DIAGNOSIS — Z3480 Encounter for supervision of other normal pregnancy, unspecified trimester: Secondary | ICD-10-CM

## 2019-06-19 DIAGNOSIS — N912 Amenorrhea, unspecified: Secondary | ICD-10-CM

## 2019-06-19 LAB — POCT URINE PREGNANCY: Preg Test, Ur: POSITIVE — AB
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
## 2019-06-19 NOTE — Addendum Note (Signed)
Addended by: Dorian Pod on: 06/19/2019 02:06 PM   Modules accepted: Orders

## 2019-07-04 ENCOUNTER — Encounter: Payer: Self-pay | Admitting: Emergency Medicine

## 2019-07-04 ENCOUNTER — Emergency Department
Admission: EM | Admit: 2019-07-04 | Discharge: 2019-07-04 | Disposition: A | Discharge status: Home / Self Care | Payer: Medicaid Other | Attending: Emergency Medicine | Admitting: Emergency Medicine

## 2019-07-04 ENCOUNTER — Other Ambulatory Visit: Payer: Self-pay

## 2019-07-04 DIAGNOSIS — R638 Other symptoms and signs concerning food and fluid intake: Secondary | ICD-10-CM | POA: Diagnosis not present

## 2019-07-04 DIAGNOSIS — R55 Syncope and collapse: Secondary | ICD-10-CM | POA: Insufficient documentation

## 2019-07-04 DIAGNOSIS — Z3A09 9 weeks gestation of pregnancy: Secondary | ICD-10-CM | POA: Diagnosis not present

## 2019-07-04 DIAGNOSIS — R251 Tremor, unspecified: Secondary | ICD-10-CM | POA: Diagnosis not present

## 2019-07-04 DIAGNOSIS — Z79899 Other long term (current) drug therapy: Secondary | ICD-10-CM | POA: Diagnosis not present

## 2019-07-04 DIAGNOSIS — Z20822 Contact with and (suspected) exposure to covid-19: Secondary | ICD-10-CM | POA: Diagnosis not present

## 2019-07-04 DIAGNOSIS — K5900 Constipation, unspecified: Secondary | ICD-10-CM | POA: Insufficient documentation

## 2019-07-04 DIAGNOSIS — O26811 Pregnancy related exhaustion and fatigue, first trimester: Secondary | ICD-10-CM | POA: Diagnosis not present

## 2019-07-04 DIAGNOSIS — O26891 Other specified pregnancy related conditions, first trimester: Secondary | ICD-10-CM | POA: Diagnosis not present

## 2019-07-04 DIAGNOSIS — O219 Vomiting of pregnancy, unspecified: Secondary | ICD-10-CM | POA: Diagnosis not present

## 2019-07-04 LAB — COMPREHENSIVE METABOLIC PANEL
ALT: 67 U/L — ABNORMAL HIGH (ref 0–44)
AST: 48 U/L — ABNORMAL HIGH (ref 15–41)
Albumin: 4.8 g/dL (ref 3.5–5.0)
Alkaline Phosphatase: 66 U/L (ref 38–126)
Anion gap: 14 (ref 5–15)
BUN: 11 mg/dL (ref 6–20)
CO2: 25 mmol/L (ref 22–32)
Calcium: 9.7 mg/dL (ref 8.9–10.3)
Chloride: 97 mmol/L — ABNORMAL LOW (ref 98–111)
Creatinine, Ser: 0.5 mg/dL (ref 0.44–1.00)
GFR calc Af Amer: >60 mL/min (ref >60)
GFR calc non Af Amer: >60 mL/min (ref >60)
Glucose, Bld: 95 mg/dL (ref 70–99)
Potassium: 3.9 mmol/L (ref 3.5–5.1)
Sodium: 136 mmol/L (ref 135–145)
Total Bilirubin: 1.6 mg/dL — ABNORMAL HIGH (ref 0.3–1.2)
Total Protein: 9.1 g/dL — ABNORMAL HIGH (ref 6.5–8.1)

## 2019-07-04 LAB — URINALYSIS, COMPLETE (UACMP) WITH MICROSCOPIC
Bacteria, UA: NONE SEEN
Glucose, UA: NEGATIVE mg/dL
Hgb urine dipstick: NEGATIVE
Ketones, ur: 80 mg/dL — AB
Nitrite: NEGATIVE
Protein, ur: 100 mg/dL — AB
Specific Gravity, Urine: 1.034 — ABNORMAL HIGH (ref 1.005–1.030)
pH: 6 (ref 5.0–8.0)

## 2019-07-04 LAB — SARS CORONAVIRUS 2 (TAT 6-24 HRS): SARS Coronavirus 2: NEGATIVE

## 2019-07-04 LAB — CBC
HCT: 46.7 % — ABNORMAL HIGH (ref 36.0–46.0)
Hemoglobin: 15.4 g/dL — ABNORMAL HIGH (ref 12.0–15.0)
MCH: 27.5 pg (ref 26.0–34.0)
MCHC: 33 g/dL (ref 30.0–36.0)
MCV: 83.4 fL (ref 80.0–100.0)
Platelets: 309 10*3/uL (ref 150–400)
RBC: 5.6 MIL/uL — ABNORMAL HIGH (ref 3.87–5.11)
RDW: 12.8 % (ref 11.5–15.5)
WBC: 8 10*3/uL (ref 4.0–10.5)
nRBC: 0 % (ref 0.0–0.2)

## 2019-07-04 LAB — LIPASE, BLOOD: Lipase: 29 U/L (ref 11–51)

## 2019-07-04 LAB — HCG, QUANTITATIVE, PREGNANCY: hCG, Beta Chain, Quant, S: 207738 m[IU]/mL — ABNORMAL HIGH (ref <5)

## 2019-07-04 MED ORDER — SODIUM CHLORIDE 0.9 % IV BOLUS
1000.0000 mL | Freq: Once | INTRAVENOUS | Status: AC
  Administered 2019-07-04: 1000 mL via INTRAVENOUS

## 2019-07-04 MED ORDER — PROMETHAZINE HCL 25 MG PO TABS: 25.0000 mg | ORAL_TABLET | Freq: Four times a day (QID) | ORAL | 0 refills | Status: DC | PRN

## 2019-07-04 MED ORDER — PROMETHAZINE HCL 25 MG/ML IJ SOLN: 25.0000 mg | Freq: Once | INTRAMUSCULAR | Status: DC

## 2019-07-04 MED ORDER — PROMETHAZINE HCL 25 MG/ML IJ SOLN
25.0000 mg | Freq: Once | INTRAMUSCULAR | Status: AC
  Administered 2019-07-04: 25 mg via INTRAVENOUS
  Filled 2019-07-04: qty 1

## 2019-07-04 NOTE — ED Notes (Signed)
Pt states she needs to go home to her son soon. Pt assisted to toilet.

## 2019-07-04 NOTE — ED Provider Notes (Signed)
[facility] Emergency Department Provider Note  ____________________________________________  Time seen: Approximately 12:37 PM  I have reviewed the triage vital signs and the nursing notes.   HISTORY  Chief Complaint Dizziness and Emesis During Pregnancy    HPI Diana Kirby is a 29 y.o. female who presents the emergency department complaining of worsening nausea, vomiting.  Patient states that she is approximately [redacted] weeks pregnant.  Patient was seen in this department 2-1/2 weeks ago for similar symptoms.  She was given medications for at home use to help with constipation and nausea.  Patient states that the medicines were helping initially, however her symptoms have worsened.  She states over the past several days every time she drinks any amount of fluid or tries to eat anything she experiences increasing nausea but that leads to emesis.  No hematic emesis.  Patient denies any fevers or chills, nasal congestion, sore throat, cough.  No abdominal pain.  Patient is still experiencing some constipation.  No dysuria, polyuria, hematuria.  No vaginal bleeding or discharge.  This is patient's third pregnancy.  Patient states that the reason she came into the emergency department for evaluation was over the past 2 days she has felt increasingly weak.  She states that at 1 point she was nauseated during the night, got up to throw up and states that she nearly passed out as everything was spinning, things started to get "dark."  Patient did not have a syncopal episode however.         History reviewed. No pertinent past medical history.  There are no problems to display for this patient.   History reviewed. No pertinent surgical history.  Prior to Admission medications   Medication Sig Start Date End Date Taking? Authorizing Provider  docusate sodium (COLACE) 100 MG capsule Take 1 capsule (100 mg total) by mouth 2 (two) times daily as needed for mild constipation.  06/18/19 07/18/19  Miguel Aschoff., MD  doxylamine, Sleep, (UNISOM) 25 MG tablet Take 0.5 tablets (12.5 mg total) by mouth 3 (three) times daily. 06/18/19 07/18/19  Miguel Aschoff., MD  promethazine (PHENERGAN) 25 MG tablet Take 1 tablet (25 mg total) by mouth every 6 (six) hours as needed for nausea or vomiting. 07/04/19   Marelin Tat, Delorise Royals, PA-C  vitamin B-6 (PYRIDOXINE) 25 MG tablet Take 1 tablet (25 mg total) by mouth 3 (three) times daily. 06/18/19 07/18/19  Miguel Aschoff., MD    Allergies Penicillins  History reviewed. No pertinent family history.  Social History Social History   Tobacco Use  . Smoking status: Never Smoker  . Smokeless tobacco: Never Used  Substance Use Topics  . Alcohol use: Not Currently  . Drug use: Never     Review of Systems  Constitutional: No fever/chills Eyes: No visual changes. No discharge ENT: No upper respiratory complaints. Cardiovascular: no chest pain. Respiratory: no cough. No SOB. Gastrointestinal: No abdominal pain.  Worsening nausea and vomiting.  No diarrhea.  Positive constipation. Genitourinary: Negative for dysuria. No hematuria Musculoskeletal: Negative for musculoskeletal pain. Skin: Negative for rash, abrasions, lacerations, ecchymosis. Neurological: Negative for headaches, focal weakness or numbness. 10-point ROS otherwise negative.  ____________________________________________   PHYSICAL EXAM:  VITAL SIGNS: ED Triage Vitals  Enc Vitals Group     BP 07/04/19 1117 112/81     Pulse Rate 07/04/19 1117 84     Resp 07/04/19 1117 18     Temp 07/04/19 1117 99.2 F (37.3 C)     Temp Source  07/04/19 1117 Oral     SpO2 07/04/19 1117 100 %     Weight 07/04/19 1132 207 lb (93.9 kg)     Height 07/04/19 1132 5\' 3"  (1.6 m)     Head Circumference --      Peak Flow --      Pain Score 07/04/19 1132 10     Pain Loc --      Pain Edu? --      Excl. in [place]? --      Constitutional: Alert and oriented. Well appearing and in no acute  distress. Eyes: Conjunctivae are normal. PERRL. EOMI. Head: Atraumatic. ENT:      Ears:       Nose: No congestion/rhinnorhea.      Mouth/Throat: Mucous membranes are slightly Kirby.  No oropharyngeal erythema or edema. Neck: No stridor.  Neck is supple full range of motion Hematological/Lymphatic/Immunilogical: No cervical lymphadenopathy. Cardiovascular: Normal rate, regular rhythm. Normal S1 and S2.  Good peripheral circulation. Respiratory: Normal respiratory effort without tachypnea or retractions. Lungs CTAB. Good air entry to the bases with no decreased or absent breath sounds. Gastrointestinal: Bowel sounds 4 quadrants. Soft and nontender to palpation. No guarding or rigidity. No palpable masses. No distention. No CVA tenderness. Musculoskeletal: Full range of motion to all extremities. No gross deformities appreciated. Neurologic:  Normal speech and language. No gross focal neurologic deficits are appreciated.  Skin:  Skin is warm, Kirby and intact. No rash noted. Psychiatric: Mood and affect are normal. Speech and behavior are normal. Patient exhibits appropriate insight and judgement.   ____________________________________________   LABS (all labs ordered are listed, but only abnormal results are displayed)  Labs Reviewed  COMPREHENSIVE METABOLIC PANEL - Abnormal; Notable for the following components:      Result Value   Chloride 97 (*)    Total Protein 9.1 (*)    AST 48 (*)    ALT 67 (*)    Total Bilirubin 1.6 (*)    All other components within normal limits  CBC - Abnormal; Notable for the following components:   RBC 5.60 (*)    Hemoglobin 15.4 (*)    HCT 46.7 (*)    All other components within normal limits  URINALYSIS, COMPLETE (UACMP) WITH MICROSCOPIC - Abnormal; Notable for the following components:   Color, Urine AMBER (*)    APPearance CLOUDY (*)    Specific Gravity, Urine 1.034 (*)    Bilirubin Urine SMALL (*)    Ketones, ur 80 (*)    Protein, ur 100 (*)     Leukocytes,Ua TRACE (*)    All other components within normal limits  HCG, QUANTITATIVE, PREGNANCY - Abnormal; Notable for the following components:   hCG, Beta Chain, Laqueta Carina 207,738 (*)    All other components within normal limits  SARS CORONAVIRUS 2 (TAT 6-24 HRS)  LIPASE, BLOOD   ____________________________________________  EKG   ____________________________________________  RADIOLOGY   No results found.  ____________________________________________    PROCEDURES  Procedure(s) performed:    Procedures    Medications  promethazine (PHENERGAN) injection 25 mg (has no administration in time range)  sodium chloride 0.9 % bolus 1,000 mL (0 mLs Intravenous Stopped 07/04/19 1658)  promethazine (PHENERGAN) injection 25 mg (25 mg Intravenous Given 07/04/19 1334)  sodium chloride 0.9 % bolus 1,000 mL (0 mLs Intravenous Stopped 07/04/19 1802)     ____________________________________________   INITIAL IMPRESSION / ASSESSMENT AND PLAN / ED COURSE  Pertinent labs & imaging results that were available during  my care of the patient were reviewed by me and considered in my medical decision making (see chart for details).  Review of the [place] CSRS was performed in accordance of the [facility] prior to dispensing any controlled drugs.  Clinical Course as of Jul 03 1801  Sat Jul 04, 2019  1241 Patient presents the emergency department complaining of 3 weeks of nausea, vomiting.  Patient states that she had been seen in this department 2 and half weeks ago for similar symptoms.  She been given medications for constipation, nausea at the time.  She states that initially these seem to help, however the nausea and vomiting have worsened.  Patient states that is to the point that anytime she puts something on her stomach she feels increasingly nauseated and will eventually throw up.  Patient states that she comes to the emergency department for evaluation as she got up to throw up last night and  had a near syncopal episode.  Patient denies any URI symptoms, chest pain, shortness of breath, abdominal pain, dysuria, polyuria, hematuria, vaginal bleeding or discharge.  Patient's main complaint is nausea and vomiting and concern for dehydration.   [JC]    Clinical Course User Index [JC] Kennisha Qin, Delorise Royals, PA-C          Patient's diagnosis is consistent with nausea and vomiting in pregnancy.  Patient presented to emergency department with complaint of 3 weeks of nausea and vomiting.  Patient states that it got to the point that she was unable to keep down foods or liquids.  Patient is exam was reassuring.  Mucous membranes were moist.  Patient was given IV fluids here, Phenergan which drastically improved patient's symptoms.  Patient was tolerating crackers, ginger ale here in the emergency department without difficulty and in fact refused second dose of antiemetics that she was feeling so much better.  Work-up was reassuring with no significant electrolyte abnormality, no evidence of gross dehydration.  At this time no indication for admission.  I discussed nausea and vomiting pregnancy as well as hyperemesis gravidarum with the patient.  No further work-up at this time as patient had no other complaints.  She is stable.  Follow-up with OB/GYN..  Patient is given ED precautions to return to the ED for any worsening or new symptoms.     ____________________________________________  FINAL CLINICAL IMPRESSION(S) / ED DIAGNOSES  Final diagnoses:  Nausea and vomiting in pregnancy      NEW MEDICATIONS STARTED DURING THIS VISIT:  ED Discharge Orders         Ordered    promethazine (PHENERGAN) 25 MG tablet  Every 6 hours PRN     07/04/19 1756              This chart was dictated using voice recognition software/Dragon. Despite best efforts to proofread, errors can occur which can change the meaning. Any change was purely unintentional.    Racheal Patches,  PA-C 07/04/19 1803    Jene Every, MD 07/05/19 [phone]

## 2019-07-04 NOTE — ED Notes (Signed)
Assumed care at this time, no report received- primary RN on break.

## 2019-07-04 NOTE — ED Provider Notes (Signed)
Patient seen by me along with PA, nausea vomiting decreased p.o. intake times approximately 2 weeks consistent with morning sickness.  We are giving IV fluids, dose of IV Phenergan and will reevaluate then.  Exam is reassuring.  No abdominal pain, no vaginal bleeding.   Jene Every, MD 07/04/19 1447

## 2019-07-04 NOTE — ED Triage Notes (Signed)
Pt arrived via POV with reports of dizziness x 2 weeks, along with nausea and vomiting, unable to keep liquids down.   Pt states she is about [redacted] weeks pregnant, pt c/o headache and generalized body aches.

## 2019-07-04 NOTE — ED Notes (Signed)
Attempted IV access unsuccessful attempt x 1

## 2019-07-04 NOTE — ED Notes (Signed)
IV dressing reinforced for better IVF infusion. Pt states she feels shaky and doesn't know why. Pt offered additional blanket.

## 2019-07-13 ENCOUNTER — Ambulatory Visit: Payer: Medicaid Other | Admitting: Surgical

## 2019-07-13 ENCOUNTER — Other Ambulatory Visit: Payer: Self-pay

## 2019-07-13 VITALS — BP 104/62 | HR 88 | Ht 63.0 in | Wt 194.9 lb

## 2019-07-13 DIAGNOSIS — Z3491 Encounter for supervision of normal pregnancy, unspecified, first trimester: Secondary | ICD-10-CM

## 2019-07-13 NOTE — Progress Notes (Signed)
Diana Kirby presents for NOB nurse interview visit. Pregnancy confirmation done 06/18/2019. G3. P2002. Pregnancy education material explained and given. 0 cats in home. NOB labs ordered. TSH/HbgA1c ordered due to BMI 30 or greater. Sickle cell ordered due to patient's race. HIV labs and drug screen were explained and ordered. PNV encouraged. Genetic screening options discussed. Genetic testing: Ordered. Patient may discuss with the provider. Patient to follow up with provider on 07/30/2019 for NOB physical. All questions answered.  Father's mother and grandmother have sickle cell.

## 2019-07-14 LAB — HIV ANTIBODY (ROUTINE TESTING W REFLEX): HIV Screen 4th Generation wRfx: NONREACTIVE

## 2019-07-14 LAB — MONITOR DRUG PROFILE 14(MW)
Amphetamine Scrn, Ur: NEGATIVE ng/mL
BARBITURATE SCREEN URINE: NEGATIVE ng/mL
BENZODIAZEPINE SCREEN, URINE: NEGATIVE ng/mL
Buprenorphine, Urine: NEGATIVE ng/mL
CANNABINOIDS UR QL SCN: NEGATIVE ng/mL
Cocaine (Metab) Scrn, Ur: NEGATIVE ng/mL
Creatinine(Crt), U: 52.3 mg/dL (ref 20.0–300.0)
Fentanyl, Urine: NEGATIVE pg/mL
Meperidine Screen, Urine: NEGATIVE ng/mL
Methadone Screen, Urine: NEGATIVE ng/mL
OXYCODONE+OXYMORPHONE UR QL SCN: NEGATIVE ng/mL
Opiate Scrn, Ur: NEGATIVE ng/mL
Ph of Urine: 6.4 (ref 4.5–8.9)
Phencyclidine Qn, Ur: NEGATIVE ng/mL
Propoxyphene Scrn, Ur: NEGATIVE ng/mL
SPECIFIC GRAVITY: 1.007
Tramadol Screen, Urine: NEGATIVE ng/mL

## 2019-07-14 LAB — URINALYSIS, ROUTINE W REFLEX MICROSCOPIC
Bilirubin, UA: NEGATIVE
Glucose, UA: NEGATIVE
Leukocytes,UA: NEGATIVE
Nitrite, UA: NEGATIVE
Protein,UA: NEGATIVE
RBC, UA: NEGATIVE
Specific Gravity, UA: 1.008 (ref 1.005–1.030)
Urobilinogen, Ur: 0.2 mg/dL (ref 0.2–1.0)
pH, UA: 7 (ref 5.0–7.5)

## 2019-07-14 LAB — ABO AND RH: Rh Factor: POSITIVE

## 2019-07-14 LAB — ANTIBODY SCREEN: Antibody Screen: NEGATIVE

## 2019-07-14 LAB — HEMOGLOBIN A1C
Est. average glucose Bld gHb Est-mCnc: 94 mg/dL
Hgb A1c MFr Bld: 4.9 % (ref 4.8–5.6)

## 2019-07-14 LAB — HGB SOLU + RFLX FRAC: Sickle Solubility Test - HGBRFX: NEGATIVE

## 2019-07-14 LAB — VARICELLA ZOSTER ANTIBODY, IGG: Varicella zoster IgG: <135 index — ABNORMAL LOW (ref >165)

## 2019-07-14 LAB — RUBELLA SCREEN: Rubella Antibodies, IGG: 1.65 index (ref >0.99)

## 2019-07-14 LAB — TSH: TSH: 0.762 u[IU]/mL (ref 0.450–4.500)

## 2019-07-14 LAB — RPR: RPR Ser Ql: NONREACTIVE

## 2019-07-14 LAB — HEPATITIS B SURFACE ANTIGEN: Hepatitis B Surface Ag: NEGATIVE

## 2019-07-15 LAB — URINE CULTURE, OB REFLEX

## 2019-07-15 LAB — GC/CHLAMYDIA PROBE AMP
Chlamydia trachomatis, NAA: NEGATIVE
Neisseria Gonorrhoeae by PCR: NEGATIVE

## 2019-07-15 LAB — CULTURE, OB URINE

## 2019-07-18 ENCOUNTER — Encounter: Payer: Self-pay | Admitting: Emergency Medicine

## 2019-07-18 ENCOUNTER — Other Ambulatory Visit: Payer: Self-pay

## 2019-07-18 DIAGNOSIS — Z3A10 10 weeks gestation of pregnancy: Secondary | ICD-10-CM | POA: Diagnosis not present

## 2019-07-18 DIAGNOSIS — O210 Mild hyperemesis gravidarum: Secondary | ICD-10-CM | POA: Diagnosis not present

## 2019-07-18 DIAGNOSIS — O219 Vomiting of pregnancy, unspecified: Secondary | ICD-10-CM | POA: Diagnosis present

## 2019-07-18 LAB — BASIC METABOLIC PANEL
Anion gap: 11 (ref 5–15)
BUN: 12 mg/dL (ref 6–20)
CO2: 20 mmol/L — ABNORMAL LOW (ref 22–32)
Calcium: 8.8 mg/dL — ABNORMAL LOW (ref 8.9–10.3)
Chloride: 103 mmol/L (ref 98–111)
Creatinine, Ser: 0.33 mg/dL — ABNORMAL LOW (ref 0.44–1.00)
GFR calc Af Amer: >60 mL/min (ref >60)
GFR calc non Af Amer: >60 mL/min (ref >60)
Glucose, Bld: 91 mg/dL (ref 70–99)
Potassium: 3.4 mmol/L — ABNORMAL LOW (ref 3.5–5.1)
Sodium: 134 mmol/L — ABNORMAL LOW (ref 135–145)

## 2019-07-18 LAB — MATERNIT21  PLUS CORE+ESS+SCA, BLOOD

## 2019-07-18 LAB — CBC WITH DIFFERENTIAL/PLATELET
Abs Immature Granulocytes: 0.02 10*3/uL (ref 0.00–0.07)
Basophils Absolute: 0 10*3/uL (ref 0.0–0.1)
Basophils Relative: 0 %
Eosinophils Absolute: 0 10*3/uL (ref 0.0–0.5)
Eosinophils Relative: 0 %
HCT: 39 % (ref 36.0–46.0)
Hemoglobin: 13.1 g/dL (ref 12.0–15.0)
Immature Granulocytes: 0 %
Lymphocytes Relative: 28 %
Lymphs Abs: 2.6 10*3/uL (ref 0.7–4.0)
MCH: 27.4 pg (ref 26.0–34.0)
MCHC: 33.6 g/dL (ref 30.0–36.0)
MCV: 81.6 fL (ref 80.0–100.0)
Monocytes Absolute: 0.6 10*3/uL (ref 0.1–1.0)
Monocytes Relative: 7 %
Neutro Abs: 6 10*3/uL (ref 1.7–7.7)
Neutrophils Relative %: 65 %
Platelets: 325 10*3/uL (ref 150–400)
RBC: 4.78 MIL/uL (ref 3.87–5.11)
RDW: 13.2 % (ref 11.5–15.5)
WBC: 9.2 10*3/uL (ref 4.0–10.5)
nRBC: 0 % (ref 0.0–0.2)

## 2019-07-18 LAB — URINALYSIS, COMPLETE (UACMP) WITH MICROSCOPIC
Bilirubin Urine: NEGATIVE
Glucose, UA: NEGATIVE mg/dL
Ketones, ur: 80 mg/dL — AB
Leukocytes,Ua: NEGATIVE
Nitrite: NEGATIVE
Protein, ur: 100 mg/dL — AB
Specific Gravity, Urine: 1.031 — ABNORMAL HIGH (ref 1.005–1.030)
pH: 6 (ref 5.0–8.0)

## 2019-07-18 LAB — HCG, QUANTITATIVE, PREGNANCY: hCG, Beta Chain, Quant, S: 106094 m[IU]/mL — ABNORMAL HIGH (ref <5)

## 2019-07-18 MED ORDER — SODIUM CHLORIDE 0.9 % IV BOLUS
1000.0000 mL | Freq: Once | INTRAVENOUS | Status: AC
  Administered 2019-07-18: 1000 mL via INTRAVENOUS

## 2019-07-18 MED ORDER — ONDANSETRON HCL 4 MG/2ML IJ SOLN
INTRAMUSCULAR | Status: AC
  Filled 2019-07-18: qty 2

## 2019-07-18 MED ORDER — ONDANSETRON HCL 4 MG/2ML IJ SOLN
4.0000 mg | Freq: Once | INTRAMUSCULAR | Status: AC
  Administered 2019-07-18: 23:00:00 4 mg via INTRAVENOUS

## 2019-07-18 MED ORDER — METOCLOPRAMIDE HCL 5 MG/ML IJ SOLN
10.0000 mg | Freq: Once | INTRAMUSCULAR | Status: AC
  Administered 2019-07-19: 10 mg via INTRAVENOUS
  Filled 2019-07-18: qty 2

## 2019-07-18 MED ORDER — DEXTROSE-NACL 5-0.9 % IV SOLN: INTRAVENOUS | Status: DC

## 2019-07-18 NOTE — ED Triage Notes (Signed)
Pt arrives POV with c/o of ongoing emesis during pregnancy. Pt reports that nothing is staying down at this time including the nausea medicine that was prescribed for her. Pt is in NAD.

## 2019-07-19 ENCOUNTER — Emergency Department
Admission: EM | Admit: 2019-07-19 | Discharge: 2019-07-19 | Disposition: A | Discharge status: Home / Self Care | Payer: Medicaid Other | Attending: Emergency Medicine | Admitting: Emergency Medicine

## 2019-07-19 DIAGNOSIS — O210 Mild hyperemesis gravidarum: Secondary | ICD-10-CM

## 2019-07-19 MED ORDER — ONDANSETRON 4 MG PO TBDP: 4.0000 mg | ORAL_TABLET | Freq: Three times a day (TID) | ORAL | 0 refills | Status: DC | PRN

## 2019-07-19 NOTE — ED Provider Notes (Signed)
[facility] Emergency Department Provider Note   ____________________________________________   First MD Initiated Contact with Patient 07/19/19 0124     (approximate)  I have reviewed the triage vital signs and the nursing notes.   HISTORY  Chief Complaint Hyperemesis Gravidarum    HPI Diana Kirby is a 29 y.o. female who presents to the ED from home with a chief complaint of nausea and vomiting.  Patient is G3, P1 (daughter died at 49 months) followed by [facility] who is approximately [redacted] weeks pregnant who has been having nausea and vomiting throughout her pregnancy. Has Phenergan at home but unable to take it due to vomiting.  Denies fever, cough, chest pain, shortness of breath, abdominal pain, vaginal bleeding/discharge, dysuria, constipation, diarrhea.       Past medical history None  There are no problems to display for this patient.   History reviewed. No pertinent surgical history.  Prior to Admission medications   Medication Sig Start Date End Date Taking? Authorizing Provider  doxylamine, Sleep, (UNISOM) 25 MG tablet Take 0.5 tablets (12.5 mg total) by mouth 3 (three) times daily. 06/18/19 07/18/19  Lilia Pro., MD  ondansetron (ZOFRAN ODT) 4 MG disintegrating tablet Take 1 tablet (4 mg total) by mouth every 8 (eight) hours as needed for nausea or vomiting. 07/19/19   Paulette Blanch, MD  promethazine (PHENERGAN) 25 MG tablet Take 1 tablet (25 mg total) by mouth every 6 (six) hours as needed for nausea or vomiting. 07/04/19   Cuthriell, Charline Bills, PA-C    Allergies Penicillins  No family history on file.  Social History Social History   Tobacco Use  . Smoking status: Never Smoker  . Smokeless tobacco: Never Used  Substance Use Topics  . Alcohol use: Not Currently  . Drug use: Never    Review of Systems  Constitutional: No fever/chills Eyes: No visual changes. ENT: No sore throat. Cardiovascular: Denies chest  pain. Respiratory: Denies shortness of breath. Gastrointestinal: No abdominal pain.  Positive for nausea and vomiting.  No diarrhea.  No constipation. Genitourinary: Negative for dysuria. Musculoskeletal: Negative for back pain. Skin: Negative for rash. Neurological: Negative for headaches, focal weakness or numbness.   ____________________________________________   PHYSICAL EXAM:  VITAL SIGNS: ED Triage Vitals  Enc Vitals Group     BP 07/18/19 2212 111/66     Pulse Rate 07/18/19 2212 82     Resp 07/18/19 2212 16     Temp 07/18/19 2212 98.9 F (37.2 C)     Temp Source 07/18/19 2212 Oral     SpO2 07/18/19 2212 100 %     Weight 07/18/19 2214 194 lb (88 kg)     Height 07/18/19 2214 5\' 3"  (1.6 m)     Head Circumference --      Peak Flow --      Pain Score 07/18/19 2218 10     Pain Loc --      Pain Edu? --      Excl. in [place]? --     Constitutional: Alert and oriented. Well appearing and in no acute distress. Eyes: Conjunctivae are normal. PERRL. EOMI. Head: Atraumatic. Nose: No congestion/rhinnorhea. Mouth/Throat: Mucous membranes are mildly dry. Neck: No stridor.   Cardiovascular: Normal rate, regular rhythm. Grossly normal heart sounds.  Good peripheral circulation. Respiratory: Normal respiratory effort.  No retractions. Lungs CTAB. Gastrointestinal: Soft and nontender to light or deep palpation. No distention. No abdominal bruits. No CVA tenderness. Musculoskeletal: No lower extremity tenderness  nor edema.  No joint effusions. Neurologic:  Normal speech and language. No gross focal neurologic deficits are appreciated. No gait instability. Skin:  Skin is warm, dry and intact. No rash noted. Psychiatric: Mood and affect are normal. Speech and behavior are normal.  ____________________________________________   LABS (all labs ordered are listed, but only abnormal results are displayed)  Labs Reviewed  BASIC METABOLIC PANEL - Abnormal; Notable for the following  components:      Result Value   Sodium 134 (*)    Potassium 3.4 (*)    CO2 20 (*)    Creatinine, Ser 0.33 (*)    Calcium 8.8 (*)    All other components within normal limits  HCG, QUANTITATIVE, PREGNANCY - Abnormal; Notable for the following components:   hCG, Beta Chain, Quant, S 106,094 (*)    All other components within normal limits  URINALYSIS, COMPLETE (UACMP) WITH MICROSCOPIC - Abnormal; Notable for the following components:   Color, Urine AMBER (*)    APPearance HAZY (*)    Specific Gravity, Urine 1.031 (*)    Hgb urine dipstick SMALL (*)    Ketones, ur 80 (*)    Protein, ur 100 (*)    Bacteria, UA RARE (*)    All other components within normal limits  CBC WITH DIFFERENTIAL/PLATELET   ____________________________________________  EKG  None ____________________________________________  RADIOLOGY  ED MD interpretation: None  Official radiology report(s): No results found.  ____________________________________________   PROCEDURES  Procedure(s) performed (including Critical Care):  Procedures   ____________________________________________   INITIAL IMPRESSION / ASSESSMENT AND PLAN / ED COURSE  As part of my medical decision making, I reviewed the following data within the electronic MEDICAL RECORD NUMBER Nursing notes reviewed and incorporated, Labs reviewed, Old chart reviewed and Notes from prior ED visits     Diana Kirby was evaluated in Emergency Department on 07/19/2019 for the symptoms described in the history of present illness. She was evaluated in the context of the global COVID-19 pandemic, which necessitated consideration that the patient might be at risk for infection with the SARS-CoV-2 virus that causes COVID-19. Institutional protocols and algorithms that pertain to the evaluation of patients at risk for COVID-19 are in a state of rapid change based on information released by regulatory bodies including the CDC and federal and state  organizations. These policies and algorithms were followed during the patient's care in the ED.    29 year old female in first trimester pregnancy who presents with nausea and vomiting.  Differential diagnosis includes but is not limited to hyperemesis gravidarum, infectious, metabolic etiologies, etc.  Laboratory and urinalysis results unremarkable other than dehydration.  Patient is feeling significantly improved after IV fluids, Zofran and Reglan.  Will discharge home with prescription for Zofran and she is to follow-up with her OB/GYN closely next week.  Strict return precautions given.  Patient verbalizes understanding and agrees with plan of care.     ____________________________________________   FINAL CLINICAL IMPRESSION(S) / ED DIAGNOSES  Final diagnoses:  Hyperemesis gravidarum     ED Discharge Orders         Ordered    ondansetron (ZOFRAN ODT) 4 MG disintegrating tablet  Every 8 hours PRN     07/19/19 0135           Note:  This document was prepared using Dragon voice recognition software and may include unintentional dictation errors.   Irean Hong, MD 07/19/19 [phone]

## 2019-07-19 NOTE — Discharge Instructions (Signed)
1.  You may take Zofran as needed for nausea/vomiting. 2.  Clear liquids x12 hours, then slowly advance diet as tolerated. 3.  Return to the ER for worsening symptoms, persistent vomiting, difficulty breathing or other concerns. 

## 2019-07-20 ENCOUNTER — Telehealth: Payer: Self-pay | Admitting: Obstetrics and Gynecology

## 2019-07-20 NOTE — Telephone Encounter (Signed)
Patient called in asking about her lab results. Please advise.

## 2019-07-21 MED ORDER — ONDANSETRON 4 MG PO TBDP: 4.0000 mg | ORAL_TABLET | Freq: Four times a day (QID) | ORAL | 0 refills | Status: DC | PRN

## 2019-07-21 NOTE — Telephone Encounter (Signed)
LM for patient to return call.

## 2019-07-21 NOTE — Telephone Encounter (Signed)
Spoke with patient and gave her the results of the genetic testing. Patient is still having vomiting. Per Dr. Logan Bores OK send in Zofran. Prescription sent to the pharmacy and patient notified.

## 2019-07-28 ENCOUNTER — Encounter: Payer: Self-pay | Admitting: Emergency Medicine

## 2019-07-28 ENCOUNTER — Emergency Department
Admission: EM | Admit: 2019-07-28 | Discharge: 2019-07-28 | Disposition: A | Discharge status: Home / Self Care | Payer: Medicaid Other | Attending: Emergency Medicine | Admitting: Emergency Medicine

## 2019-07-28 ENCOUNTER — Other Ambulatory Visit: Payer: Self-pay

## 2019-07-28 DIAGNOSIS — R519 Headache, unspecified: Secondary | ICD-10-CM | POA: Diagnosis not present

## 2019-07-28 DIAGNOSIS — O210 Mild hyperemesis gravidarum: Secondary | ICD-10-CM | POA: Insufficient documentation

## 2019-07-28 DIAGNOSIS — Z3A12 12 weeks gestation of pregnancy: Secondary | ICD-10-CM | POA: Diagnosis not present

## 2019-07-28 DIAGNOSIS — O26891 Other specified pregnancy related conditions, first trimester: Secondary | ICD-10-CM | POA: Diagnosis present

## 2019-07-28 LAB — URINALYSIS, COMPLETE (UACMP) WITH MICROSCOPIC
Bacteria, UA: NONE SEEN
Bilirubin Urine: NEGATIVE
Glucose, UA: NEGATIVE mg/dL
Ketones, ur: 80 mg/dL — AB
Nitrite: NEGATIVE
Protein, ur: 100 mg/dL — AB
Specific Gravity, Urine: 1.033 — ABNORMAL HIGH (ref 1.005–1.030)
Squamous Epithelial / HPF: >50 — ABNORMAL HIGH (ref 0–5)
pH: 6 (ref 5.0–8.0)

## 2019-07-28 LAB — COMPREHENSIVE METABOLIC PANEL
ALT: 34 U/L (ref 0–44)
AST: 26 U/L (ref 15–41)
Albumin: 4.1 g/dL (ref 3.5–5.0)
Alkaline Phosphatase: 42 U/L (ref 38–126)
Anion gap: 8 (ref 5–15)
BUN: 10 mg/dL (ref 6–20)
CO2: 22 mmol/L (ref 22–32)
Calcium: 9.2 mg/dL (ref 8.9–10.3)
Chloride: 105 mmol/L (ref 98–111)
Creatinine, Ser: 0.46 mg/dL (ref 0.44–1.00)
GFR calc Af Amer: >60 mL/min (ref >60)
GFR calc non Af Amer: >60 mL/min (ref >60)
Glucose, Bld: 97 mg/dL (ref 70–99)
Potassium: 3.3 mmol/L — ABNORMAL LOW (ref 3.5–5.1)
Sodium: 135 mmol/L (ref 135–145)
Total Bilirubin: 1.7 mg/dL — ABNORMAL HIGH (ref 0.3–1.2)
Total Protein: 7.1 g/dL (ref 6.5–8.1)

## 2019-07-28 LAB — CBC
HCT: 38.3 % (ref 36.0–46.0)
Hemoglobin: 12.7 g/dL (ref 12.0–15.0)
MCH: 27.4 pg (ref 26.0–34.0)
MCHC: 33.2 g/dL (ref 30.0–36.0)
MCV: 82.7 fL (ref 80.0–100.0)
Platelets: 249 10*3/uL (ref 150–400)
RBC: 4.63 MIL/uL (ref 3.87–5.11)
RDW: 13.7 % (ref 11.5–15.5)
WBC: 6.5 10*3/uL (ref 4.0–10.5)
nRBC: 0 % (ref 0.0–0.2)

## 2019-07-28 LAB — HCG, QUANTITATIVE, PREGNANCY: hCG, Beta Chain, Quant, S: 111751 m[IU]/mL — ABNORMAL HIGH (ref <5)

## 2019-07-28 MED ORDER — METOCLOPRAMIDE HCL 5 MG/ML IJ SOLN
10.0000 mg | Freq: Once | INTRAMUSCULAR | Status: AC
  Administered 2019-07-28: 10 mg via INTRAVENOUS
  Filled 2019-07-28: qty 2

## 2019-07-28 MED ORDER — SODIUM CHLORIDE 0.9 % IV BOLUS
1000.0000 mL | Freq: Once | INTRAVENOUS | Status: AC
  Administered 2019-07-28: 1000 mL via INTRAVENOUS

## 2019-07-28 MED ORDER — ONDANSETRON HCL 4 MG/2ML IJ SOLN
4.0000 mg | Freq: Once | INTRAMUSCULAR | Status: AC
  Administered 2019-07-28: 4 mg via INTRAVENOUS
  Filled 2019-07-28: qty 2

## 2019-07-28 MED ORDER — ONDANSETRON HCL 4 MG/5ML PO SOLN: 4.0000 mg | Freq: Three times a day (TID) | ORAL | 0 refills | Status: DC | PRN

## 2019-07-28 NOTE — ED Triage Notes (Signed)
Pt reports continues to have nausea and vomiting during this pregnancy. Pt reports has been seen for it several times but all anyone does is give her nausea pills that don't work. Pt reports now she has a HA from all the vomiting.

## 2019-07-28 NOTE — ED Notes (Signed)
Attempted to find fetal heart tones, unable to at this time.

## 2019-07-28 NOTE — Discharge Instructions (Signed)
Return to the ER for new, worsening, or persistent severe vomiting, weakness, abdominal pain, fever, or any other new or worsening symptoms that concern you.  We have prescribed the liquid version of the Zofran nausea medication.  If your insurance covers this, this may be easier to take than the pills.

## 2019-07-28 NOTE — ED Provider Notes (Signed)
[facility] Emergency Department Provider Note ____________________________________________   First MD Initiated Contact with Patient 07/28/19 1321     (approximate)  I have reviewed the triage vital signs and the nursing notes.   HISTORY  Chief Complaint Emesis During Pregnancy    HPI Diana Kirby is a 29 y.o. female G3 P2 at [redacted] weeks pregnant who presents with persistent nausea and vomiting over the last several weeks.  She has been taking p.o. Zofran at home although often cannot keep it down.  She has vomited numerous times throughout the day, but denies any abdominal pain, diarrhea, urinary symptoms, or vaginal bleeding.  She states that there were few streaks of blood in the vomitus today.  She does report a frontal headache for the last several days.  History reviewed. No pertinent past medical history.  There are no problems to display for this patient.   History reviewed. No pertinent surgical history.  Prior to Admission medications   Medication Sig Start Date End Date Taking? Authorizing Provider  doxylamine, Sleep, (UNISOM) 25 MG tablet Take 0.5 tablets (12.5 mg total) by mouth 3 (three) times daily. 06/18/19 07/18/19  Miguel Aschoff., MD  ondansetron (ZOFRAN ODT) 4 MG disintegrating tablet Take 1 tablet (4 mg total) by mouth every 8 (eight) hours as needed for nausea or vomiting. 07/19/19   Irean Hong, MD  ondansetron (ZOFRAN ODT) 4 MG disintegrating tablet Take 1 tablet (4 mg total) by mouth every 6 (six) hours as needed for nausea. 07/21/19   Linzie Collin, MD  ondansetron [facility]) 4 MG/5ML solution Take 5 mLs (4 mg total) by mouth every 8 (eight) hours as needed for nausea or vomiting. 07/28/19   Dionne Bucy, MD  promethazine (PHENERGAN) 25 MG tablet Take 1 tablet (25 mg total) by mouth every 6 (six) hours as needed for nausea or vomiting. 07/04/19   Cuthriell, Delorise Royals, PA-C    Allergies Penicillins  No family history on  file.  Social History Social History   Tobacco Use  . Smoking status: Never Smoker  . Smokeless tobacco: Never Used  Substance Use Topics  . Alcohol use: Not Currently  . Drug use: Never    Review of Systems  Constitutional: No fever. Eyes: No redness. ENT: No sore throat. Cardiovascular: Denies chest pain. Respiratory: Denies shortness of breath. Gastrointestinal: Positive for nausea vomiting. Genitourinary: Negative for dysuria.  Musculoskeletal: Positive for back pain. Skin: Negative for rash. Neurological: Positive for headache.   ____________________________________________   PHYSICAL EXAM:  VITAL SIGNS: ED Triage Vitals  Enc Vitals Group     BP 07/28/19 1034 110/67     Pulse Rate 07/28/19 1034 95     Resp 07/28/19 1034 20     Temp 07/28/19 1034 98.5 F (36.9 C)     Temp Source 07/28/19 1034 Oral     SpO2 07/28/19 1034 98 %     Weight 07/28/19 1033 207 lb (93.9 kg)     Height 07/28/19 1033 5\' 3"  (1.6 m)     Head Circumference --      Peak Flow --      Pain Score 07/28/19 1033 10     Pain Loc --      Pain Edu? --      Excl. in [place]? --     Constitutional: Alert and oriented. Well appearing and in no acute distress. Eyes: Conjunctivae are normal.  No scleral icterus. Head: Atraumatic. Nose: No congestion/rhinnorhea. Mouth/Throat: Mucous membranes are  moist.   Neck: Normal range of motion.  Cardiovascular: Normal rate, regular rhythm. Grossly normal heart sounds.  Good peripheral circulation. Respiratory: Normal respiratory effort.  No retractions. Lungs CTAB. Gastrointestinal: Soft and nontender. No distention.  Genitourinary: No flank tenderness. Musculoskeletal: Extremities warm and well perfused.  Neurologic:  Normal speech and language. No gross focal neurologic deficits are appreciated.  Skin:  Skin is warm and dry. No rash noted. Psychiatric: Mood and affect are normal. Speech and behavior are  normal.  ____________________________________________   LABS (all labs ordered are listed, but only abnormal results are displayed)  Labs Reviewed  COMPREHENSIVE METABOLIC PANEL - Abnormal; Notable for the following components:      Result Value   Potassium 3.3 (*)    Total Bilirubin 1.7 (*)    All other components within normal limits  URINALYSIS, COMPLETE (UACMP) WITH MICROSCOPIC - Abnormal; Notable for the following components:   Color, Urine AMBER (*)    APPearance CLOUDY (*)    Specific Gravity, Urine 1.033 (*)    Hgb urine dipstick SMALL (*)    Ketones, ur 80 (*)    Protein, ur 100 (*)    Leukocytes,Ua TRACE (*)    Squamous Epithelial / LPF >50 (*)    All other components within normal limits  HCG, QUANTITATIVE, PREGNANCY - Abnormal; Notable for the following components:   hCG, Beta Chain, Quant, S 111,751 (*)    All other components within normal limits  CBC   ____________________________________________  EKG   ____________________________________________  RADIOLOGY    ____________________________________________   PROCEDURES  Procedure(s) performed: No  Procedures  Critical Care performed: No ____________________________________________   INITIAL IMPRESSION / ASSESSMENT AND PLAN / ED COURSE  Pertinent labs & imaging results that were available during my care of the patient were reviewed by me and considered in my medical decision making (see chart for details).  29 year old female G3, P2 at approximately [redacted] weeks pregnant presents with persistent nausea and vomiting.  I reviewed the past medical records in [place]. The patient was seen in the ED previously on 4/11 with a similar presentation, and prior to that on 3/27.  Both times she was able to go home after fluids and antiemetics.  She is followed at [facility].  On exam today she is overall well-appearing.  Her vital signs are normal.  The abdomen is soft and nontender.  Presentation  is consistent with hyperemesis gravidarum.  I have a low suspicion for gastroenteritis or other infectious etiology given the duration of the symptoms.  The patient does describe a few streaks of blood in the vomitus suggestive of possible mild Mallory-Weiss tear although there is no evidence of active bleeding.  Labs were obtained and are reassuring.  We will give a fluid bolus, IV antiemetic, and reassess.  ----------------------------------------- 6:16 PM on 07/28/2019 -----------------------------------------  After fluids, the patient was feeling better but reported a continued headache.  This resolved with Reglan.  She stated she was feeling significantly better and wanted to go home.  She had no vomiting in the ED.  Return precautions were given and she expressed understanding. ____________________________________________   FINAL CLINICAL IMPRESSION(S) / ED DIAGNOSES  Final diagnoses:  Hyperemesis gravidarum      NEW MEDICATIONS STARTED DURING THIS VISIT:  Discharge Medication List as of 07/28/2019  3:46 PM    START taking these medications   Details  ondansetron (ZOFRAN) 4 MG/5ML solution Take 5 mLs (4 mg total) by mouth every 8 (eight) hours  as needed for nausea or vomiting., Starting Tue 07/28/2019, Normal         Note:  This document was prepared using Dragon voice recognition software and may include unintentional dictation errors.    Dionne Bucy, MD 07/28/19 [phone]

## 2019-07-30 ENCOUNTER — Other Ambulatory Visit (HOSPITAL_COMMUNITY)
Admission: RE | Admit: 2019-07-30 | Discharge: 2019-07-30 | Disposition: A | Discharge status: Home / Self Care | Payer: Medicaid Other | Source: Ambulatory Visit | Attending: Obstetrics and Gynecology | Admitting: Obstetrics and Gynecology

## 2019-07-30 ENCOUNTER — Ambulatory Visit (INDEPENDENT_AMBULATORY_CARE_PROVIDER_SITE_OTHER): Payer: Medicaid Other | Admitting: Obstetrics and Gynecology

## 2019-07-30 ENCOUNTER — Encounter: Payer: Self-pay | Admitting: Obstetrics and Gynecology

## 2019-07-30 ENCOUNTER — Other Ambulatory Visit: Payer: Self-pay

## 2019-07-30 VITALS — BP 114/69 | HR 91 | Wt 190.0 lb

## 2019-07-30 DIAGNOSIS — Z3491 Encounter for supervision of normal pregnancy, unspecified, first trimester: Secondary | ICD-10-CM

## 2019-07-30 DIAGNOSIS — Z124 Encounter for screening for malignant neoplasm of cervix: Secondary | ICD-10-CM | POA: Insufficient documentation

## 2019-07-30 DIAGNOSIS — Z3A12 12 weeks gestation of pregnancy: Secondary | ICD-10-CM

## 2019-07-30 NOTE — Addendum Note (Signed)
Addended by: Dorian Pod on: 07/30/2019 03:05 PM   Modules accepted: Orders

## 2019-07-30 NOTE — Progress Notes (Signed)
NOB: She continues to experience problems with nausea and vomiting daily.  She has not had a chance to pick up the Zofran because it was too expensive.  Prescription for Diclegis given and instruction sheet regarding nausea and vomiting given. MaterniT 70 reviewed-normal female.  Instructions on clear liquids including dilute Gatorade and slushy's discussed. Consider AFP next visit Physical examination General NAD, Conversant  HEENT Atraumatic; Op clear with mmm.  Normo-cephalic. Pupils reactive. Anicteric sclerae  Thyroid/Neck Smooth without nodularity or enlargement. Normal ROM.  Neck Supple.  Skin No rashes, lesions or ulceration. Normal palpated skin turgor. No nodularity.  Breasts: No masses or discharge.  Symmetric.  No axillary adenopathy.  Lungs: Clear to auscultation.No rales or wheezes. Normal Respiratory effort, no retractions.  Heart: NSR.  No murmurs or rubs appreciated. No periferal edema  Abdomen: Soft.  Non-tender.  No masses.  No HSM. No hernia  Extremities: Moves all appropriately.  Normal ROM for age. No lymphadenopathy.  Neuro: Oriented to PPT.  Normal mood. Normal affect.     Pelvic:   Vulva: Normal appearance.  No lesions.  Vagina: No lesions or abnormalities noted.  Support: Normal pelvic support.  Urethra No masses tenderness or scarring.  Meatus Normal size without lesions or prolapse.  Cervix: Normal appearance.  No lesions.  Anus: Normal exam.  No lesions.  Perineum: Normal exam.  No lesions.        Bimanual   Adnexae: No masses.  Non-tender to palpation.  Uterus: Enlarged. 12wks  Pos FHTs  Non-tender.  Mobile.  AV.  Adnexae: No masses.  Non-tender to palpation.  Cul-de-sac: Negative for abnormality.  Adnexae: No masses.  Non-tender to palpation.         Pelvimetry   Diagonal: Reached.  Spines: Average.  Sacrum: Concave.  Pubic Arch: Normal.

## 2019-08-03 LAB — CYTOLOGY - PAP: Diagnosis: NEGATIVE

## 2019-08-04 ENCOUNTER — Encounter: Payer: Medicaid Other | Admitting: Obstetrics and Gynecology

## 2019-08-06 ENCOUNTER — Emergency Department: Payer: Medicaid Other

## 2019-08-06 ENCOUNTER — Emergency Department
Admission: EM | Admit: 2019-08-06 | Discharge: 2019-08-07 | Disposition: A | Discharge status: Home / Self Care | Payer: Medicaid Other | Attending: Emergency Medicine | Admitting: Emergency Medicine

## 2019-08-06 ENCOUNTER — Other Ambulatory Visit: Payer: Self-pay

## 2019-08-06 DIAGNOSIS — R1011 Right upper quadrant pain: Secondary | ICD-10-CM | POA: Diagnosis not present

## 2019-08-06 DIAGNOSIS — Z3A13 13 weeks gestation of pregnancy: Secondary | ICD-10-CM | POA: Diagnosis not present

## 2019-08-06 DIAGNOSIS — R1110 Vomiting, unspecified: Secondary | ICD-10-CM

## 2019-08-06 DIAGNOSIS — R42 Dizziness and giddiness: Secondary | ICD-10-CM | POA: Insufficient documentation

## 2019-08-06 DIAGNOSIS — O26892 Other specified pregnancy related conditions, second trimester: Secondary | ICD-10-CM | POA: Diagnosis not present

## 2019-08-06 DIAGNOSIS — O210 Mild hyperemesis gravidarum: Secondary | ICD-10-CM | POA: Diagnosis not present

## 2019-08-06 DIAGNOSIS — O219 Vomiting of pregnancy, unspecified: Secondary | ICD-10-CM | POA: Diagnosis present

## 2019-08-06 LAB — COMPREHENSIVE METABOLIC PANEL
ALT: 57 U/L — ABNORMAL HIGH (ref 0–44)
AST: 58 U/L — ABNORMAL HIGH (ref 15–41)
Albumin: 4.2 g/dL (ref 3.5–5.0)
Alkaline Phosphatase: 45 U/L (ref 38–126)
Anion gap: 10 (ref 5–15)
BUN: 11 mg/dL (ref 6–20)
CO2: 24 mmol/L (ref 22–32)
Calcium: 9.6 mg/dL (ref 8.9–10.3)
Chloride: 104 mmol/L (ref 98–111)
Creatinine, Ser: 0.36 mg/dL — ABNORMAL LOW (ref 0.44–1.00)
GFR calc Af Amer: >60 mL/min (ref >60)
GFR calc non Af Amer: >60 mL/min (ref >60)
Glucose, Bld: 94 mg/dL (ref 70–99)
Potassium: 3.5 mmol/L (ref 3.5–5.1)
Sodium: 138 mmol/L (ref 135–145)
Total Bilirubin: 2.2 mg/dL — ABNORMAL HIGH (ref 0.3–1.2)
Total Protein: 7.8 g/dL (ref 6.5–8.1)

## 2019-08-06 LAB — URINALYSIS, ROUTINE W REFLEX MICROSCOPIC
Bilirubin Urine: NEGATIVE
Glucose, UA: NEGATIVE mg/dL
Ketones, ur: 80 mg/dL — AB
Nitrite: NEGATIVE
Protein, ur: 100 mg/dL — AB
Specific Gravity, Urine: 1.033 — ABNORMAL HIGH (ref 1.005–1.030)
pH: 6 (ref 5.0–8.0)

## 2019-08-06 LAB — CBC WITH DIFFERENTIAL/PLATELET
Abs Immature Granulocytes: 0.03 10*3/uL (ref 0.00–0.07)
Basophils Absolute: 0 10*3/uL (ref 0.0–0.1)
Basophils Relative: 0 %
Eosinophils Absolute: 0 10*3/uL (ref 0.0–0.5)
Eosinophils Relative: 0 %
HCT: 39.3 % (ref 36.0–46.0)
Hemoglobin: 12.9 g/dL (ref 12.0–15.0)
Immature Granulocytes: 0 %
Lymphocytes Relative: 21 %
Lymphs Abs: 1.8 10*3/uL (ref 0.7–4.0)
MCH: 27.3 pg (ref 26.0–34.0)
MCHC: 32.8 g/dL (ref 30.0–36.0)
MCV: 83.3 fL (ref 80.0–100.0)
Monocytes Absolute: 0.4 10*3/uL (ref 0.1–1.0)
Monocytes Relative: 5 %
Neutro Abs: 6.2 10*3/uL (ref 1.7–7.7)
Neutrophils Relative %: 74 %
Platelets: 274 10*3/uL (ref 150–400)
RBC: 4.72 MIL/uL (ref 3.87–5.11)
RDW: 13.9 % (ref 11.5–15.5)
WBC: 8.4 10*3/uL (ref 4.0–10.5)
nRBC: 0 % (ref 0.0–0.2)

## 2019-08-06 LAB — LIPASE, BLOOD: Lipase: 37 U/L (ref 11–51)

## 2019-08-06 MED ORDER — ONDANSETRON HCL 4 MG/2ML IJ SOLN
4.0000 mg | Freq: Once | INTRAMUSCULAR | Status: AC
  Administered 2019-08-06: 22:00:00 4 mg via INTRAVENOUS
  Filled 2019-08-06: qty 2

## 2019-08-06 MED ORDER — SODIUM CHLORIDE 0.9 % IV BOLUS
1000.0000 mL | Freq: Once | INTRAVENOUS | Status: AC
  Administered 2019-08-06: 1000 mL via INTRAVENOUS

## 2019-08-06 MED ORDER — METOCLOPRAMIDE HCL 5 MG/ML IJ SOLN
10.0000 mg | Freq: Once | INTRAMUSCULAR | Status: AC
  Administered 2019-08-06: 22:00:00 10 mg via INTRAVENOUS
  Filled 2019-08-06: qty 2

## 2019-08-06 MED ORDER — DEXTROSE-NACL 5-0.9 % IV SOLN
250.0000 mL | Freq: Once | INTRAVENOUS | Status: AC
  Administered 2019-08-06: 250 mL via INTRAVENOUS

## 2019-08-06 NOTE — ED Provider Notes (Addendum)
[facility] Emergency Department Provider Note  ____________________________________________   First MD Initiated Contact with Patient 08/06/19 2108     (approximate)  I have reviewed the triage vital signs and the nursing notes.   HISTORY  Chief Complaint Emesis During Pregnancy    HPI Diana Kirby is a 29 y.o. female patient is a G3, P2 about [redacted] weeks pregnant who comes in for hyperemesis.  Patient's been to the ER frequently.  She states the IV medicines worked the best and she is easily able to go home afterwards.  She states that her p.o. medications do not work as well.  Patient's nausea and vomiting have occurred multiple times today, intermittent, nothing makes it better, nothing makes them worse.  Patient has some associated dizziness due to dehydration.  Denies any abdominal cramping, vaginal bleeding, dysuria, vaginal discharge.  Patient does have a little bit of epigastric tenderness associated with it.          History reviewed. No pertinent past medical history.  There are no problems to display for this patient.   History reviewed. No pertinent surgical history.  Prior to Admission medications   Medication Sig Start Date End Date Taking? Authorizing Provider  doxylamine, Sleep, (UNISOM) 25 MG tablet Take 0.5 tablets (12.5 mg total) by mouth 3 (three) times daily. 06/18/19 07/18/19  Miguel Aschoff., MD  ondansetron (ZOFRAN ODT) 4 MG disintegrating tablet Take 1 tablet (4 mg total) by mouth every 8 (eight) hours as needed for nausea or vomiting. 07/19/19   Irean Hong, MD  ondansetron (ZOFRAN ODT) 4 MG disintegrating tablet Take 1 tablet (4 mg total) by mouth every 6 (six) hours as needed for nausea. 07/21/19   Linzie Collin, MD  ondansetron [facility]) 4 MG/5ML solution Take 5 mLs (4 mg total) by mouth every 8 (eight) hours as needed for nausea or vomiting. 07/28/19   Dionne Bucy, MD  promethazine (PHENERGAN) 25 MG tablet Take 1  tablet (25 mg total) by mouth every 6 (six) hours as needed for nausea or vomiting. 07/04/19   Cuthriell, Delorise Royals, PA-C    Allergies Penicillins  History reviewed. No pertinent family history.  Social History Social History   Tobacco Use  . Smoking status: Never Smoker  . Smokeless tobacco: Never Used  Substance Use Topics  . Alcohol use: Not Currently  . Drug use: Never      Review of Systems Constitutional: No fever/chills Eyes: No visual changes. ENT: No sore throat. Cardiovascular: Denies chest pain. Respiratory: Denies shortness of breath. Gastrointestinal: No abdominal pain.  Positive nausea and vomiting no diarrhea.  No constipation. Genitourinary: Negative for dysuria. Musculoskeletal: Negative for back pain. Skin: Negative for rash. Neurological: Negative for headaches, focal weakness or numbness. All other ROS negative ____________________________________________   PHYSICAL EXAM:  VITAL SIGNS: ED Triage Vitals  Enc Vitals Group     BP 08/06/19 1711 (!) 100/54     Pulse Rate 08/06/19 1711 61     Resp 08/06/19 1711 18     Temp 08/06/19 1711 98.3 F (36.8 C)     Temp Source 08/06/19 1711 Oral     SpO2 08/06/19 1711 97 %     Weight 08/06/19 1713 194 lb (88 kg)     Height 08/06/19 1713 5\' 3"  (1.6 m)     Head Circumference --      Peak Flow --      Pain Score 08/06/19 1713 0     Pain Loc --  Pain Edu? --      Excl. in [place]? --     Constitutional: Alert and oriented. Well appearing and in no acute distress. Eyes: Conjunctivae are normal. EOMI. Head: Atraumatic. Nose: No congestion/rhinnorhea. Mouth/Throat: Mucous membranes are moist.   Neck: No stridor. Trachea Midline. FROM Cardiovascular: Normal rate, regular rhythm. Grossly normal heart sounds.  Good peripheral circulation. Respiratory: Normal respiratory effort.  No retractions. Lungs CTAB. Gastrointestinal: Soft and nontender. No distention. No abdominal bruits.  Musculoskeletal: No lower  extremity tenderness nor edema.  No joint effusions. Neurologic:  Normal speech and language. No gross focal neurologic deficits are appreciated.  Skin:  Skin is warm, dry and intact. No rash noted. Psychiatric: Mood and affect are normal. Speech and behavior are normal. GU: Deferred   ____________________________________________   LABS (all labs ordered are listed, but only abnormal results are displayed)  Labs Reviewed  COMPREHENSIVE METABOLIC PANEL - Abnormal; Notable for the following components:      Result Value   Creatinine, Ser 0.36 (*)    AST 58 (*)    ALT 57 (*)    Total Bilirubin 2.2 (*)    All other components within normal limits  URINALYSIS, ROUTINE W REFLEX MICROSCOPIC - Abnormal; Notable for the following components:   Color, Urine AMBER (*)    APPearance CLOUDY (*)    Specific Gravity, Urine 1.033 (*)    Hgb urine dipstick MODERATE (*)    Ketones, ur 80 (*)    Protein, ur 100 (*)    Leukocytes,Ua SMALL (*)    Bacteria, UA RARE (*)    All other components within normal limits  CBC WITH DIFFERENTIAL/PLATELET  LIPASE, BLOOD   ____________________________________________ RADIOLOGY Pending ____________________________________________   PROCEDURES  Procedure(s) performed (including Critical Care):  Procedures   ____________________________________________   INITIAL IMPRESSION / ASSESSMENT AND PLAN / ED COURSE  Diana Kirby was evaluated in Emergency Department on 08/06/2019 for the symptoms described in the history of present illness. She was evaluated in the context of the global COVID-19 pandemic, which necessitated consideration that the patient might be at risk for infection with the SARS-CoV-2 virus that causes COVID-19. Institutional protocols and algorithms that pertain to the evaluation of patients at risk for COVID-19 are in a state of rapid change based on information released by regulatory bodies including the CDC and federal and state  organizations. These policies and algorithms were followed during the patient's care in the ED.     Patient is a 29 year old who comes in with hyperemesis secondary to pregnancy.  Patient had an ultrasound on 3/11 that confirmed intrauterine pregnancy and she denies abdominal cramping, vaginal bleeding to suggest issues with the pregnancy.  Will get labs to evaluate Electra abnormalities, AKI, dehydration.  LFTs were slightly elevated so get ultrasound given patient does have a very minimal amount of epigastric tenderness just to make sure there is no evidence of gallstones that could be causing patient's nausea and vomiting.  Will treat patient with IV antinausea medicine and fluids and reassess patient.  Labs are reassuring.  Notes of severe dehydration however patient does have ketones 80 and protein 100 and her urine.  No signs of asymptomatic bacteriuria  LFTs slightly elevated.  Ultrasound pending  Ultrasound was negative.  Patient on reevaluation is feeling much better.  Offered patient additional nausea medicine or fluid but patient states she is feeling better and wants to try drinking something.  Will discharge patient home on      ____________________________________________  FINAL CLINICAL IMPRESSION(S) / ED DIAGNOSES   Final diagnoses:  RUQ pain  Hyperemesis  Hyperemesis gravidarum      MEDICATIONS GIVEN DURING THIS VISIT:  Medications  metoCLOPramide (REGLAN) injection 10 mg (10 mg Intravenous Given 08/06/19 2150)  ondansetron (ZOFRAN) injection 4 mg (4 mg Intravenous Given 08/06/19 2152)  sodium chloride 0.9 % bolus 1,000 mL (0 mLs Intravenous Stopped 08/06/19 2256)  dextrose 5 %-0.9 % sodium chloride infusion (250 mLs Intravenous New Bag/Given 08/06/19 2256)     ED Discharge Orders    None       Note:  This document was prepared using Dragon voice recognition software and may include unintentional dictation errors.   Concha Se, MD 08/06/19 2316      Concha Se, MD 08/06/19 [phone]

## 2019-08-06 NOTE — ED Notes (Signed)
PT given water for PO challenge and a phone to call a ride home.

## 2019-08-06 NOTE — ED Notes (Signed)
US at bedside

## 2019-08-06 NOTE — Discharge Instructions (Addendum)
You have hyperemesis secondary her pregnancy.  We will give you medicines.  Your lab work was reassuring.  Liver function test were slightly elevated but your ultrasound was negative for gallbladder issues.  This can be followed up with your primary care doctor.

## 2019-08-06 NOTE — ED Notes (Signed)
[place] sent to lab

## 2019-08-06 NOTE — ED Triage Notes (Signed)
Pt arrives POV from home for vomiting during pregnancy. Pt reports she is vomiting 8-9 times a day and "nothing they have given me for nausea is helping". Pt reports "I have only pissed one time today". Reports dizziness, nausea, and "feeling miserable". Pt A&Ox4 and in NAD, ambulatory from lobby to triage with steady gait.

## 2019-08-06 NOTE — ED Notes (Signed)
Verbal order given by Dr. Larinda Buttery to draw CMP, CBC, lipase and collect u/a

## 2019-08-06 NOTE — ED Notes (Addendum)
Pt here for hyperemesis is about [redacted] weeks pregnant, states G3, P2. Pt states has been seen for the same has meds at home but unable to keep them down. Pt states vomits around 7-8 times daily. No diarrhea or dysuria, no abd pain or bleeding.

## 2019-08-06 NOTE — ED Notes (Signed)
Pt resting comfortably with eyes closed, awaiting results.

## 2019-08-06 NOTE — ED Notes (Signed)
Pt resting comfortably, states nausea is better.

## 2019-08-28 ENCOUNTER — Encounter: Payer: Self-pay | Admitting: Obstetrics and Gynecology

## 2019-08-28 ENCOUNTER — Other Ambulatory Visit: Payer: Self-pay

## 2019-08-28 ENCOUNTER — Ambulatory Visit (INDEPENDENT_AMBULATORY_CARE_PROVIDER_SITE_OTHER): Payer: Medicaid Other | Admitting: Obstetrics and Gynecology

## 2019-08-28 VITALS — BP 99/69 | HR 85 | Wt 184.6 lb

## 2019-08-28 DIAGNOSIS — Z3482 Encounter for supervision of other normal pregnancy, second trimester: Secondary | ICD-10-CM

## 2019-08-28 DIAGNOSIS — Z1379 Encounter for other screening for genetic and chromosomal anomalies: Secondary | ICD-10-CM

## 2019-08-28 DIAGNOSIS — Z3A16 16 weeks gestation of pregnancy: Secondary | ICD-10-CM

## 2019-08-28 LAB — POCT URINALYSIS DIPSTICK OB
Bilirubin, UA: NEGATIVE
Glucose, UA: NEGATIVE
Ketones, UA: NEGATIVE
Leukocytes, UA: NEGATIVE
Nitrite, UA: NEGATIVE
Spec Grav, UA: 1.025 (ref 1.010–1.025)
Urobilinogen, UA: 0.2 E.U./dL
pH, UA: 6.5 (ref 5.0–8.0)

## 2019-08-28 NOTE — Patient Instructions (Signed)
Second Trimester of Pregnancy The second trimester is from week 14 through week 27 (months 4 through 6). The second trimester is often a time when you feel your best. Your body has adjusted to being pregnant, and you begin to feel better physically. Usually, morning sickness has lessened or quit completely, you may have more energy, and you may have an increase in appetite. The second trimester is also a time when the fetus is growing rapidly. At the end of the sixth month, the fetus is about 9 inches long and weighs about 1 pounds. You will likely begin to feel the baby move (quickening) between 16 and 20 weeks of pregnancy. Body changes during your second trimester Your body continues to go through many changes during your second trimester. The changes vary from woman to woman.  Your weight will continue to increase. You will notice your lower abdomen bulging out.  You may begin to get stretch marks on your hips, abdomen, and breasts.  You may develop headaches that can be relieved by medicines. The medicines should be approved by your health care provider.  You may urinate more often because the fetus is pressing on your bladder.  You may develop or continue to have heartburn as a result of your pregnancy.  You may develop constipation because certain hormones are causing the muscles that push waste through your intestines to slow down.  You may develop hemorrhoids or swollen, bulging veins (varicose veins).  You may have back pain. This is caused by: ? Weight gain. ? Pregnancy hormones that are relaxing the joints in your pelvis. ? A shift in weight and the muscles that support your balance.  Your breasts will continue to grow and they will continue to become tender.  Your gums may bleed and may be sensitive to brushing and flossing.  Dark spots or blotches (chloasma, mask of pregnancy) may develop on your face. This will likely fade after the baby is born.  A dark line from your  belly button to the pubic area (linea nigra) may appear. This will likely fade after the baby is born.  You may have changes in your hair. These can include thickening of your hair, rapid growth, and changes in texture. Some women also have hair loss during or after pregnancy, or hair that feels dry or thin. Your hair will most likely return to normal after your baby is born. What to expect at prenatal visits During a routine prenatal visit:  You will be weighed to make sure you and the fetus are growing normally.  Your blood pressure will be taken.  Your abdomen will be measured to track your baby's growth.  The fetal heartbeat will be listened to.  Any test results from the previous visit will be discussed. Your health care provider may ask you:  How you are feeling.  If you are feeling the baby move.  If you have had any abnormal symptoms, such as leaking fluid, bleeding, severe headaches, or abdominal cramping.  If you are using any tobacco products, including cigarettes, chewing tobacco, and electronic cigarettes.  If you have any questions. Other tests that may be performed during your second trimester include:  Blood tests that check for: ? Low iron levels (anemia). ? High blood sugar that affects pregnant women (gestational diabetes) between 24 and 28 weeks. ? Rh antibodies. This is to check for a protein on red blood cells (Rh factor).  Urine tests to check for infections, diabetes, or protein in the   urine.  An ultrasound to confirm the proper growth and development of the baby.  An amniocentesis to check for possible genetic problems.  Fetal screens for spina bifida and Down syndrome.  HIV (human immunodeficiency virus) testing. Routine prenatal testing includes screening for HIV, unless you choose not to have this test. Follow these instructions at home: Medicines  Follow your health care provider's instructions regarding medicine use. Specific medicines may be  either safe or unsafe to take during pregnancy.  Take a prenatal vitamin that contains at least 600 micrograms (mcg) of folic acid.  If you develop constipation, try taking a stool softener if your health care provider approves. Eating and drinking   Eat a balanced diet that includes fresh fruits and vegetables, whole grains, good sources of protein such as meat, eggs, or tofu, and low-fat dairy. Your health care provider will help you determine the amount of weight gain that is right for you.  Avoid raw meat and uncooked cheese. These carry germs that can cause birth defects in the baby.  If you have low calcium intake from food, talk to your health care provider about whether you should take a daily calcium supplement.  Limit foods that are high in fat and processed sugars, such as fried and sweet foods.  To prevent constipation: ? Drink enough fluid to keep your urine clear or pale yellow. ? Eat foods that are high in fiber, such as fresh fruits and vegetables, whole grains, and beans. Activity  Exercise only as directed by your health care provider. Most women can continue their usual exercise routine during pregnancy. Try to exercise for 30 minutes at least 5 days a week. Stop exercising if you experience uterine contractions.  Avoid heavy lifting, wear low heel shoes, and practice good posture.  A sexual relationship may be continued unless your health care provider directs you otherwise. Relieving pain and discomfort  Wear a good support bra to prevent discomfort from breast tenderness.  Take warm sitz baths to soothe any pain or discomfort caused by hemorrhoids. Use hemorrhoid cream if your health care provider approves.  Rest with your legs elevated if you have leg cramps or low back pain.  If you develop varicose veins, wear support hose. Elevate your feet for 15 minutes, 3-4 times a day. Limit salt in your diet. Prenatal Care  Write down your questions. Take them to  your prenatal visits.  Keep all your prenatal visits as told by your health care provider. This is important. Safety  Wear your seat belt at all times when driving.  Make a list of emergency phone numbers, including numbers for family, friends, the hospital, and police and fire departments. General instructions  Ask your health care provider for a referral to a local prenatal education class. Begin classes no later than the beginning of month 6 of your pregnancy.  Ask for help if you have counseling or nutritional needs during pregnancy. Your health care provider can offer advice or refer you to specialists for help with various needs.  Do not use hot tubs, steam rooms, or saunas.  Do not douche or use tampons or scented sanitary pads.  Do not cross your legs for long periods of time.  Avoid cat litter boxes and soil used by cats. These carry germs that can cause birth defects in the baby and possibly loss of the fetus by miscarriage or stillbirth.  Avoid all smoking, herbs, alcohol, and unprescribed drugs. Chemicals in these products can affect the formation   formation and growth of the baby.  Do not use any products that contain nicotine or tobacco, such as cigarettes and e-cigarettes. If you need help quitting, ask your health care provider.  Visit your dentist if you have not gone yet during your pregnancy. Use a soft toothbrush to brush your teeth and be gentle when you floss. Contact a health care provider if:  You have dizziness.  You have mild pelvic cramps, pelvic pressure, or nagging pain in the abdominal area.  You have persistent nausea, vomiting, or diarrhea.  You have a bad smelling vaginal discharge.  You have pain when you urinate. Get help right away if:  You have a fever.  You are leaking fluid from your vagina.  You have spotting or bleeding from your vagina.  You have severe abdominal cramping or pain.  You have rapid weight gain or weight loss.  You  have shortness of breath with chest pain.  You notice sudden or extreme swelling of your face, hands, ankles, feet, or legs.  You have not felt your baby move in over an hour.  You have severe headaches that do not go away when you take medicine.  You have vision changes. Summary  The second trimester is from week 14 through week 27 (months 4 through 6). It is also a time when the fetus is growing rapidly.  Your body goes through many changes during pregnancy. The changes vary from woman to woman.  Avoid all smoking, herbs, alcohol, and unprescribed drugs. These chemicals affect the formation and growth your baby.  Do not use any tobacco products, such as cigarettes, chewing tobacco, and e-cigarettes. If you need help quitting, ask your health care provider.  Contact your health care provider if you have any questions. Keep all prenatal visits as told by your health care provider. This is important. This information is not intended to replace advice given to you by your health care provider. Make sure you discuss any questions you have with your health care provider. Document Revised: 07/18/2018 Document Reviewed: 05/01/2016 Elsevier Patient Education  2020 [employer]   Breastfeeding  Choosing to breastfeed is one of the best decisions you can make for yourself and your baby. A change in hormones during pregnancy causes your breasts to make breast milk in your milk-producing glands. Hormones prevent breast milk from being released before your baby is born. They also prompt milk flow after birth. Once breastfeeding has begun, thoughts of your baby, as well as his or her sucking or crying, can stimulate the release of milk from your milk-producing glands. Benefits of breastfeeding Research shows that breastfeeding offers many health benefits for infants and mothers. It also offers a cost-free and convenient way to feed your baby. For your baby  Your first milk (colostrum) helps your  baby's digestive system to function better.  Special cells in your milk (antibodies) help your baby to fight off infections.  Breastfed babies are less likely to develop asthma, allergies, obesity, or type 2 diabetes. They are also at lower risk for sudden infant death syndrome (SIDS).  Nutrients in breast milk are better able to meet your baby's needs compared to infant formula.  Breast milk improves your baby's brain development. For you  Breastfeeding helps to create a very special bond between you and your baby.  Breastfeeding is convenient. Breast milk costs nothing and is always available at the correct temperature.  Breastfeeding helps to burn calories. It helps you to lose the weight that you   gained during pregnancy.  Breastfeeding makes your uterus return faster to its size before pregnancy. It also slows bleeding (lochia) after you give birth.  Breastfeeding helps to lower your risk of developing type 2 diabetes, osteoporosis, rheumatoid arthritis, cardiovascular disease, and breast, ovarian, uterine, and endometrial cancer later in life. Breastfeeding basics Starting breastfeeding  Find a comfortable place to sit or lie down, with your neck and back well-supported.  Place a pillow or a rolled-up blanket under your baby to bring him or her to the level of your breast (if you are seated). Nursing pillows are specially designed to help support your arms and your baby while you breastfeed.  Make sure that your baby's tummy (abdomen) is facing your abdomen.  Gently massage your breast. With your fingertips, massage from the outer edges of your breast inward toward the nipple. This encourages milk flow. If your milk flows slowly, you may need to continue this action during the feeding.  Support your breast with 4 fingers underneath and your thumb above your nipple (make the letter "C" with your hand). Make sure your fingers are well away from your nipple and your baby's  mouth.  Stroke your baby's lips gently with your finger or nipple.  When your baby's mouth is open wide enough, quickly bring your baby to your breast, placing your entire nipple and as much of the areola as possible into your baby's mouth. The areola is the colored area around your nipple. ? More areola should be visible above your baby's upper lip than below the lower lip. ? Your baby's lips should be opened and extended outward (flanged) to ensure an adequate, comfortable latch. ? Your baby's tongue should be between his or her lower gum and your breast.  Make sure that your baby's mouth is correctly positioned around your nipple (latched). Your baby's lips should create a seal on your breast and be turned out (everted).  It is common for your baby to suck about 2-3 minutes in order to start the flow of breast milk. Latching Teaching your baby how to latch onto your breast properly is very important. An improper latch can cause nipple pain, decreased milk supply, and poor weight gain in your baby. Also, if your baby is not latched onto your nipple properly, he or she may swallow some air during feeding. This can make your baby fussy. Burping your baby when you switch breasts during the feeding can help to get rid of the air. However, teaching your baby to latch on properly is still the best way to prevent fussiness from swallowing air while breastfeeding. Signs that your baby has successfully latched onto your nipple  Silent tugging or silent sucking, without causing you pain. Infant's lips should be extended outward (flanged).  Swallowing heard between every 3-4 sucks once your milk has started to flow (after your let-down milk reflex occurs).  Muscle movement above and in front of his or her ears while sucking. Signs that your baby has not successfully latched onto your nipple  Sucking sounds or smacking sounds from your baby while breastfeeding.  Nipple pain. If you think your baby  has not latched on correctly, slip your finger into the corner of your baby's mouth to break the suction and place it between your baby's gums. Attempt to start breastfeeding again. Signs of successful breastfeeding Signs from your baby  Your baby will gradually decrease the number of sucks or will completely stop sucking.  Your baby will fall asleep.  Your   baby's body will relax.  Your baby will retain a small amount of milk in his or her mouth.  Your baby will let go of your breast by himself or herself. Signs from you  Breasts that have increased in firmness, weight, and size 1-3 hours after feeding.  Breasts that are softer immediately after breastfeeding.  Increased milk volume, as well as a change in milk consistency and color by the fifth day of breastfeeding.  Nipples that are not sore, cracked, or bleeding. Signs that your baby is getting enough milk  Wetting at least 1-2 diapers during the first 24 hours after birth.  Wetting at least 5-6 diapers every 24 hours for the first week after birth. The urine should be clear or pale yellow by the age of 5 days.  Wetting 6-8 diapers every 24 hours as your baby continues to grow and develop.  At least 3 stools in a 24-hour period by the age of 5 days. The stool should be soft and yellow.  At least 3 stools in a 24-hour period by the age of 7 days. The stool should be seedy and yellow.  No loss of weight greater than 10% of birth weight during the first 3 days of life.  Average weight gain of 4-7 oz (113-198 g) per week after the age of 4 days.  Consistent daily weight gain by the age of 5 days, without weight loss after the age of 2 weeks. After a feeding, your baby may spit up a small amount of milk. This is normal. Breastfeeding frequency and duration Frequent feeding will help you make more milk and can prevent sore nipples and extremely full breasts (breast engorgement). Breastfeed when you feel the need to reduce the  fullness of your breasts or when your baby shows signs of hunger. This is called "breastfeeding on demand." Signs that your baby is hungry include:  Increased alertness, activity, or restlessness.  Movement of the head from side to side.  Opening of the mouth when the corner of the mouth or cheek is stroked (rooting).  Increased sucking sounds, smacking lips, cooing, sighing, or squeaking.  Hand-to-mouth movements and sucking on fingers or hands.  Fussing or crying. Avoid introducing a pacifier to your baby in the first 4-6 weeks after your baby is born. After this time, you may choose to use a pacifier. Research has shown that pacifier use during the first year of a baby's life decreases the risk of sudden infant death syndrome (SIDS). Allow your baby to feed on each breast as long as he or she wants. When your baby unlatches or falls asleep while feeding from the first breast, offer the second breast. Because newborns are often sleepy in the first few weeks of life, you may need to awaken your baby to get him or her to feed. Breastfeeding times will vary from baby to baby. However, the following rules can serve as a guide to help you make sure that your baby is properly fed:  Newborns (babies 4 weeks of age or younger) may breastfeed every 1-3 hours.  Newborns should not go without breastfeeding for longer than 3 hours during the day or 5 hours during the night.  You should breastfeed your baby a minimum of 8 times in a 24-hour period. Breast milk pumping     Pumping and storing breast milk allows you to make sure that your baby is exclusively fed your breast milk, even at times when you are unable to breastfeed. This   is especially important if you go back to work while you are still breastfeeding, or if you are not able to be present during feedings. Your lactation consultant can help you find a method of pumping that works best for you and give you guidelines about how long it is safe  to store breast milk. Caring for your breasts while you breastfeed Nipples can become dry, cracked, and sore while breastfeeding. The following recommendations can help keep your breasts moisturized and healthy:  Avoid using soap on your nipples.  Wear a supportive bra designed especially for nursing. Avoid wearing underwire-style bras or extremely tight bras (sports bras).  Air-dry your nipples for 3-4 minutes after each feeding.  Use only cotton bra pads to absorb leaked breast milk. Leaking of breast milk between feedings is normal.  Use lanolin on your nipples after breastfeeding. Lanolin helps to maintain your skin's normal moisture barrier. Pure lanolin is not harmful (not toxic) to your baby. You may also hand express a few drops of breast milk and gently massage that milk into your nipples and allow the milk to air-dry. In the first few weeks after giving birth, some women experience breast engorgement. Engorgement can make your breasts feel heavy, warm, and tender to the touch. Engorgement peaks within 3-5 days after you give birth. The following recommendations can help to ease engorgement:  Completely empty your breasts while breastfeeding or pumping. You may want to start by applying warm, moist heat (in the shower or with warm, water-soaked hand towels) just before feeding or pumping. This increases circulation and helps the milk flow. If your baby does not completely empty your breasts while breastfeeding, pump any extra milk after he or she is finished.  Apply ice packs to your breasts immediately after breastfeeding or pumping, unless this is too uncomfortable for you. To do this: ? Put ice in a plastic bag. ? Place a towel between your skin and the bag. ? Leave the ice on for 20 minutes, 2-3 times a day.  Make sure that your baby is latched on and positioned properly while breastfeeding. If engorgement persists after 48 hours of following these recommendations, contact your  health care provider or a lactation consultant. Overall health care recommendations while breastfeeding  Eat 3 healthy meals and 3 snacks every day. Well-nourished mothers who are breastfeeding need an additional 450-500 calories a day. You can meet this requirement by increasing the amount of a balanced diet that you eat.  Drink enough water to keep your urine pale yellow or clear.  Rest often, relax, and continue to take your prenatal vitamins to prevent fatigue, stress, and low vitamin and mineral levels in your body (nutrient deficiencies).  Do not use any products that contain nicotine or tobacco, such as cigarettes and e-cigarettes. Your baby may be harmed by chemicals from cigarettes that pass into breast milk and exposure to secondhand smoke. If you need help quitting, ask your health care provider.  Avoid alcohol.  Do not use illegal drugs or marijuana.  Talk with your health care provider before taking any medicines. These include over-the-counter and prescription medicines as well as vitamins and herbal supplements. Some medicines that may be harmful to your baby can pass through breast milk.  It is possible to become pregnant while breastfeeding. If birth control is desired, ask your health care provider about options that will be safe while breastfeeding your baby. Where to find more information: [employer]: www.llli.org Contact a health care provider   if:  You feel like you want to stop breastfeeding or have become frustrated with breastfeeding.  Your nipples are cracked or bleeding.  Your breasts are red, tender, or warm.  You have: ? Painful breasts or nipples. ? A swollen area on either breast. ? A fever or chills. ? Nausea or vomiting. ? Drainage other than breast milk from your nipples.  Your breasts do not become full before feedings by the fifth day after you give birth.  You feel sad and depressed.  Your baby is: ? Too sleepy to eat  well. ? Having trouble sleeping. ? More than 1 week old and wetting fewer than 6 diapers in a 24-hour period. ? Not gaining weight by 5 days of age.  Your baby has fewer than 3 stools in a 24-hour period.  Your baby's skin or the white parts of his or her eyes become yellow. Get help right away if:  Your baby is overly tired (lethargic) and does not want to wake up and feed.  Your baby develops an unexplained fever. Summary  Breastfeeding offers many health benefits for infant and mothers.  Try to breastfeed your infant when he or she shows early signs of hunger.  Gently tickle or stroke your baby's lips with your finger or nipple to allow the baby to open his or her mouth. Bring the baby to your breast. Make sure that much of the areola is in your baby's mouth. Offer one side and burp the baby before you offer the other side.  Talk with your health care provider or lactation consultant if you have questions or you face problems as you breastfeed. This information is not intended to replace advice given to you by your health care provider. Make sure you discuss any questions you have with your health care provider. Document Revised: 06/20/2017 Document Reviewed: 04/27/2016 Elsevier Patient Education  2020 [employer]  

## 2019-08-28 NOTE — Progress Notes (Signed)
ROB: Doing well, no complaints. Normal MaterniT21, female. For AFP today.  RTC in 4 weeks, for anatomy scan at that time. Discussed breastfeeding, patient notes she has been unsucessful in the past but would like to try again.    The following were addressed during this visit:  Breastfeeding Education - Early initiation of breastfeeding    Comments: Keeps milk supply adequate, helps contract uterus and slow bleeding, and early milk is the perfect first food and is easy to digest.   - The importance of exclusive breastfeeding    Comments: Provides antibodies, Lower risk of breast and ovarian cancers, and type-2 diabetes,Helps your body recover, Reduced chance of SIDS.   - Risks of giving your baby anything other than breast milk if you are breastfeeding    Comments: Make the baby less content with breastfeeds, may make my baby more susceptible to illness, and may reduce my milk supply.

## 2019-08-28 NOTE — Progress Notes (Signed)
ROB-Pt present for routine prenatal care. Pt stated that her legs feel heavy sometimes.

## 2019-08-30 LAB — AFP, SERUM, OPEN SPINA BIFIDA
AFP MoM: 1.49
AFP Value: 43.3 ng/mL
Gest. Age on Collection Date: 15 weeks
Maternal Age At EDD: 28.7 yr
OSBR Risk 1 IN: 5617
Test Results:: NEGATIVE
Weight: 185 [lb_av]

## 2019-09-23 ENCOUNTER — Other Ambulatory Visit: Payer: Self-pay

## 2019-09-23 ENCOUNTER — Ambulatory Visit (INDEPENDENT_AMBULATORY_CARE_PROVIDER_SITE_OTHER): Payer: Self-pay

## 2019-09-23 ENCOUNTER — Other Ambulatory Visit: Payer: Self-pay | Admitting: Internal Medicine

## 2019-09-23 ENCOUNTER — Ambulatory Visit (INDEPENDENT_AMBULATORY_CARE_PROVIDER_SITE_OTHER): Payer: Medicaid Other | Admitting: Obstetrics and Gynecology

## 2019-09-23 ENCOUNTER — Other Ambulatory Visit: Payer: Self-pay | Admitting: Obstetrics and Gynecology

## 2019-09-23 VITALS — BP 107/67 | HR 96 | Wt 187.2 lb

## 2019-09-23 DIAGNOSIS — Z3492 Encounter for supervision of normal pregnancy, unspecified, second trimester: Secondary | ICD-10-CM

## 2019-09-23 DIAGNOSIS — Z3A20 20 weeks gestation of pregnancy: Secondary | ICD-10-CM

## 2019-09-23 DIAGNOSIS — Z3482 Encounter for supervision of other normal pregnancy, second trimester: Secondary | ICD-10-CM

## 2019-09-23 LAB — POCT URINALYSIS DIPSTICK OB
Bilirubin, UA: NEGATIVE
Glucose, UA: NEGATIVE
Ketones, UA: NEGATIVE
Nitrite, UA: NEGATIVE
Odor: NEGATIVE
Spec Grav, UA: 1.02 (ref 1.010–1.025)
Urobilinogen, UA: 0.2 E.U./dL
pH, UA: 6.5 (ref 5.0–8.0)

## 2019-09-23 NOTE — Progress Notes (Signed)
ROB: Patient reports daily movement.  Taking vitamins as directed.  FAS ultrasound completed today.  No change in due date.

## 2019-09-23 NOTE — Progress Notes (Signed)
ROB- trouble sleeping.

## 2019-10-21 ENCOUNTER — Other Ambulatory Visit: Payer: Self-pay

## 2019-10-21 ENCOUNTER — Encounter: Payer: Self-pay | Admitting: Obstetrics and Gynecology

## 2019-10-21 ENCOUNTER — Ambulatory Visit (INDEPENDENT_AMBULATORY_CARE_PROVIDER_SITE_OTHER): Payer: Self-pay | Admitting: Obstetrics and Gynecology

## 2019-10-21 VITALS — BP 101/68 | HR 90 | Wt 192.3 lb

## 2019-10-21 DIAGNOSIS — O99891 Other specified diseases and conditions complicating pregnancy: Secondary | ICD-10-CM

## 2019-10-21 DIAGNOSIS — Z3402 Encounter for supervision of normal first pregnancy, second trimester: Secondary | ICD-10-CM

## 2019-10-21 DIAGNOSIS — G479 Sleep disorder, unspecified: Secondary | ICD-10-CM

## 2019-10-21 DIAGNOSIS — F329 Major depressive disorder, single episode, unspecified: Secondary | ICD-10-CM

## 2019-10-21 DIAGNOSIS — F419 Anxiety disorder, unspecified: Secondary | ICD-10-CM

## 2019-10-21 DIAGNOSIS — F32A Depression, unspecified: Secondary | ICD-10-CM | POA: Insufficient documentation

## 2019-10-21 DIAGNOSIS — B3731 Acute candidiasis of vulva and vagina: Secondary | ICD-10-CM

## 2019-10-21 DIAGNOSIS — Z3A24 24 weeks gestation of pregnancy: Secondary | ICD-10-CM

## 2019-10-21 DIAGNOSIS — B373 Candidiasis of vulva and vagina: Secondary | ICD-10-CM

## 2019-10-21 DIAGNOSIS — Z8659 Personal history of other mental and behavioral disorders: Secondary | ICD-10-CM

## 2019-10-21 LAB — POCT URINALYSIS DIPSTICK OB
Bilirubin, UA: NEGATIVE
Glucose, UA: NEGATIVE
Ketones, UA: NEGATIVE
Leukocytes, UA: NEGATIVE
Nitrite, UA: NEGATIVE
Spec Grav, UA: 1.025 (ref 1.010–1.025)
Urobilinogen, UA: 0.2 E.U./dL
pH, UA: 6.5 (ref 5.0–8.0)

## 2019-10-21 MED ORDER — FLUCONAZOLE 150 MG PO TABS: 150.0000 mg | ORAL_TABLET | Freq: Once | ORAL | 3 refills | Status: AC

## 2019-10-21 MED ORDER — HYDROXYZINE HCL 25 MG PO TABS: 25.0000 mg | ORAL_TABLET | Freq: Every evening | ORAL | 2 refills | Status: DC | PRN

## 2019-10-21 NOTE — Patient Instructions (Signed)
GESTATIONAL DIABETES TESTING FOR PREGNANCY  Pregnant women can develop a condition known as Gestational Diabetes (diabetes brought on by pregnancy) which can pose a risk to both mother and baby. A glucose tolerance test is a common type of testing for potential gestational diabetes.  There are several tests intended to identify gestational diabetes in pregnant women. The first, called the Glucose Challenge Screening, is a preliminary screening test performed between 26-28 weeks. If a woman tests positive during this screening test, the second test, called the Glucose Tolerance Test, may be performed. This test will diagnose whether diabetes exists or not by indicating whether or not the body is using glucose (a type of sugar) effectively.  The Glucose Challenge Screening is now considered to be a standard test performed during the early part of the third trimester of pregnancy.  What is the Glucose Challenge Screening Test? No preparation is required prior to the test. During the test, the mother is asked to drink a sweet liquid (glucose) and then will have blood drawn one hour from having the drink, as blood glucose levels normally peak within one hour. No fasting is required prior to this test.  The test evaluates how your body processes sugar. A high level in your blood may indicate your body is not processing sugar effectively (positive test). If the results of this screen are positive, the woman may have the Glucose Tolerance Test performed. It is important to note that not all women who test positive for the Glucose Challenge Screening test are found to have diabetes upon further diagnosis.  What is the Glucose Tolerance Test? Prior to the taking the glucose tolerance test, your doctor will ask you to make sure and eat at least 150mg of carbohydrates (about what you will get from a slice or two of bread) for three days prior to the time you will be asked to fast. You will not be permitted to eat  or drink anything but sips of water for 14 hours prior to the test, so it is best to schedule the test for first thing in the morning.  Additionally, you should plan to have someone drive you to and from the test since your energy levels may be low and there is a slight possibility you may feel light-headed.  When you arrive, the technician will draw blood to measure your baseline "fasting blood glucose level". You will be asked to drink a larger volume (or more concentrated solution) of the glucose drink than was used in the initial Glucose Challenge Screening test. Your blood will be drawn and tested every hour for the next three hours.  The following are the values that the American Diabetes Association considers to be abnormal during the Glucose Tolerance Test:  Interval Abnormal reading Fasting 95 mg/dl or higher One hour 180 mg/dl or higher Two hours 155 mg/dl or higher Three hours 140 mg/dl or higher  What if my Glucose Tolerance Test Results are Abnormal? If only one of your readings comes back abnormal, your doctor may suggest some changes to your diet and/or test you again later in the pregnancy. If two or more of your readings come back abnormal, you'll be diagnosed with Gestational Diabetes and your doctor or midwife will talk to you about a treatment plan. Treating diabetes during pregnancy is extremely important to protect the health of both mother and baby.   Compiled using information from the following sources:  1. American Diabetes Association  https://www.diabetes.org  2. [employer]  https://www.emedicine.com    3. National Institute of Diabetes and Digestive and Kidney Diseases  

## 2019-10-21 NOTE — Progress Notes (Signed)
ROB-Pt present for routine prenatal care. Pt c/o of possible yeast infection and trouble sleeping. Pt stated that she has not tried anything for the yeast infection or sleeping issues. PHQ-9=16.  GAD-7=15.

## 2019-10-21 NOTE — Progress Notes (Signed)
ROB: Notes that she has concerns of a yeast infection. Switched soaps recently. Has had some irritation/burning. Also has some concerns for sleep, has h/o insomnia in the past. Has tried certain teas, sleeping positions, but still having difficulty. Also notes she has a h/o anxiety and depression in the past, would like a referral to a counselor as she has suffered 2 family losses recently, dealing with grief.  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  Discussed possible need for antidepressants/anxiolytic medications later in pregnancy or postpartum, however will wait until after counseling initiated and reassess. GAD-7 = 15, PHQ-9 =16. Will refer. Also given prescription for Vistaril to help with sleep/anxiety at night. Size>dates today, f/u next visit, may need growth ultrasound. RTC in 4 weeks, for 1 hr glucola.

## 2019-11-18 ENCOUNTER — Encounter: Payer: Self-pay | Admitting: Obstetrics and Gynecology

## 2019-11-18 ENCOUNTER — Ambulatory Visit (INDEPENDENT_AMBULATORY_CARE_PROVIDER_SITE_OTHER): Payer: Medicaid Other | Admitting: Obstetrics and Gynecology

## 2019-11-18 ENCOUNTER — Other Ambulatory Visit: Payer: Medicaid Other

## 2019-11-18 VITALS — BP 102/70 | HR 97 | Wt 196.8 lb

## 2019-11-18 DIAGNOSIS — Z3A28 28 weeks gestation of pregnancy: Secondary | ICD-10-CM | POA: Diagnosis not present

## 2019-11-18 DIAGNOSIS — Z23 Encounter for immunization: Secondary | ICD-10-CM

## 2019-11-18 DIAGNOSIS — Z3402 Encounter for supervision of normal first pregnancy, second trimester: Secondary | ICD-10-CM

## 2019-11-18 LAB — POCT URINALYSIS DIPSTICK OB
Bilirubin, UA: NEGATIVE
Blood, UA: NEGATIVE
Glucose, UA: NEGATIVE
Ketones, UA: NEGATIVE
Leukocytes, UA: NEGATIVE
Nitrite, UA: NEGATIVE
POC,PROTEIN,UA: NEGATIVE
Spec Grav, UA: 1.01 (ref 1.010–1.025)
Urobilinogen, UA: 0.2 E.U./dL
pH, UA: 6.5 (ref 5.0–8.0)

## 2019-11-18 NOTE — Progress Notes (Signed)
ROB: Rib pain on left side.  Discussed with patient.  1 hour GCT today.  Reports daily fetal movement.

## 2019-11-18 NOTE — Progress Notes (Signed)
Patient comes in today for ROB visit. No concerns today. Patient does not know what she would like to do for [facility] at this time.

## 2019-11-18 NOTE — Addendum Note (Signed)
Addended by: Dorian Pod on: 11/18/2019 04:20 PM   Modules accepted: Orders

## 2019-11-19 LAB — CBC
Hematocrit: 34.6 % (ref 34.0–46.6)
Hemoglobin: 11.1 g/dL (ref 11.1–15.9)
MCH: 28.2 pg (ref 26.6–33.0)
MCHC: 32.1 g/dL (ref 31.5–35.7)
MCV: 88 fL (ref 79–97)
Platelets: 212 10*3/uL (ref 150–450)
RBC: 3.94 x10E6/uL (ref 3.77–5.28)
RDW: 12.5 % (ref 11.7–15.4)
WBC: 7.6 10*3/uL (ref 3.4–10.8)

## 2019-11-19 LAB — RPR: RPR Ser Ql: NONREACTIVE

## 2019-11-19 LAB — GLUCOSE, 1 HOUR GESTATIONAL: Gestational Diabetes Screen: 92 mg/dL (ref 65–139)

## 2019-12-09 ENCOUNTER — Other Ambulatory Visit: Payer: Self-pay

## 2019-12-09 ENCOUNTER — Encounter: Payer: Self-pay | Admitting: Obstetrics and Gynecology

## 2019-12-09 ENCOUNTER — Ambulatory Visit (INDEPENDENT_AMBULATORY_CARE_PROVIDER_SITE_OTHER): Payer: Medicaid Other | Admitting: Obstetrics and Gynecology

## 2019-12-09 VITALS — BP 93/64 | HR 89 | Wt 200.9 lb

## 2019-12-09 DIAGNOSIS — M549 Dorsalgia, unspecified: Secondary | ICD-10-CM

## 2019-12-09 DIAGNOSIS — O99891 Other specified diseases and conditions complicating pregnancy: Secondary | ICD-10-CM

## 2019-12-09 DIAGNOSIS — Z3A31 31 weeks gestation of pregnancy: Secondary | ICD-10-CM

## 2019-12-09 DIAGNOSIS — Z3483 Encounter for supervision of other normal pregnancy, third trimester: Secondary | ICD-10-CM

## 2019-12-09 LAB — POCT URINALYSIS DIPSTICK OB
Bilirubin, UA: NEGATIVE
Glucose, UA: NEGATIVE
Ketones, UA: NEGATIVE
Nitrite, UA: NEGATIVE
Spec Grav, UA: 1.02 (ref 1.010–1.025)
Urobilinogen, UA: 0.2 E.U./dL
pH, UA: 6 (ref 5.0–8.0)

## 2019-12-09 NOTE — Patient Instructions (Addendum)

## 2019-12-09 NOTE — Progress Notes (Signed)
ROB-Pt present for routine prenatal care. Pt c/o of right upper abd pain under breast area and lower/mid back pain.                                                                                                                                                                                                                                                                                                                                                                     ROB-Pt present for routine prenatal care. Pt c/o right upper abd pain under breast area and lower/mid back pains.

## 2019-12-09 NOTE — Progress Notes (Signed)
ROB: Notes common complaints of pregnancy, back pain, abdominal pain. Also noting pain under left breast and bruising. Discussed may be likely to active fetal movement. Also discussed use of belly band.  Answered all general questions regarding pregnancy with patient and partner.  RTC in 2 weeks.

## 2019-12-25 ENCOUNTER — Ambulatory Visit: Payer: Self-pay | Admitting: *Deleted

## 2019-12-25 ENCOUNTER — Encounter: Payer: Self-pay | Admitting: Obstetrics and Gynecology

## 2019-12-25 ENCOUNTER — Other Ambulatory Visit: Payer: Self-pay

## 2019-12-25 ENCOUNTER — Telehealth: Payer: Self-pay | Admitting: Obstetrics and Gynecology

## 2019-12-25 ENCOUNTER — Observation Stay
Admission: EM | Admit: 2019-12-25 | Discharge: 2019-12-25 | Disposition: A | Discharge status: Home / Self Care | Payer: Medicaid Other | Attending: Obstetrics and Gynecology | Admitting: Obstetrics and Gynecology

## 2019-12-25 DIAGNOSIS — Z3A33 33 weeks gestation of pregnancy: Secondary | ICD-10-CM | POA: Diagnosis not present

## 2019-12-25 DIAGNOSIS — R109 Unspecified abdominal pain: Secondary | ICD-10-CM

## 2019-12-25 DIAGNOSIS — Z3490 Encounter for supervision of normal pregnancy, unspecified, unspecified trimester: Secondary | ICD-10-CM

## 2019-12-25 DIAGNOSIS — Z20822 Contact with and (suspected) exposure to covid-19: Secondary | ICD-10-CM | POA: Insufficient documentation

## 2019-12-25 DIAGNOSIS — R519 Headache, unspecified: Secondary | ICD-10-CM

## 2019-12-25 DIAGNOSIS — O26893 Other specified pregnancy related conditions, third trimester: Secondary | ICD-10-CM | POA: Diagnosis present

## 2019-12-25 HISTORY — DX: Anxiety disorder, unspecified: F41.9

## 2019-12-25 LAB — SARS CORONAVIRUS 2 BY RT PCR (HOSPITAL ORDER, PERFORMED IN ~~LOC~~ HOSPITAL LAB): SARS Coronavirus 2: NEGATIVE

## 2019-12-25 MED ORDER — ACETAMINOPHEN 500 MG PO TABS
ORAL_TABLET | ORAL | Status: AC
  Filled 2019-12-25: qty 2

## 2019-12-25 MED ORDER — ACETAMINOPHEN 500 MG PO TABS
1000.0000 mg | ORAL_TABLET | Freq: Four times a day (QID) | ORAL | Status: DC | PRN
  Administered 2019-12-25: 1000 mg via ORAL

## 2019-12-25 NOTE — Telephone Encounter (Signed)
Patient called in requesting to speak with her provider stating that she was at the mall and found that she was having difficulty breathing. Patient stated she became out of breath and then she had to vomit. Informed patient that both of her providers were out of the office at the moment and that I would send a message on her behalf. Informed patient if she was having difficulty breathing or her symptoms worsened she would need to go to the ED. Patient verbalized understanding. Could you please advise?

## 2019-12-25 NOTE — OB Triage Note (Signed)
Pt [redacted]w[redacted]d G2P2 presents to [facility] through ED w/ c/o ctx, and dizzy nausea spell that occurred at mall at [facility] with one large emesis episode. Pt denies vag bleeding or LOF and reports positive fetal movement. Pt reports current nausea, headache, with some spotty vision. VSS. Monitors applied and assessing.

## 2019-12-25 NOTE — Telephone Encounter (Signed)
Patient's husband called and patient talking in background c/o increased difficulty breathing since 2 pm. C/o headache , when standing gets lightheaded and dizzy, nausea and vomited x 1. Chest pressure reported was coming and going and now constant. Patient is 33 weeks and 2 days pregnant. Instructed patient to go to ED and if symptoms worsen to call 911.Patient and husband verbalized understanding of care advise.   Reason for Disposition . Pregnant or postpartum (from 0 to 6 weeks after delivery)  Answer Assessment - Initial Assessment Questions 1. RESPIRATORY STATUS: "Describe your breathing?" (e.g., wheezing, shortness of breath, unable to speak, severe coughing)      Difficulty breathing when standing and now at rest 2. ONSET: "When did this breathing problem begin?"      2 pm  3. PATTERN "Does the difficult breathing come and go, or has it been constant since it started?"      Started as come and go but now constant 4. SEVERITY: "How bad is your breathing?" (e.g., mild, moderate, severe)    - MILD: No SOB at rest, mild SOB with walking, speaks normally in sentences, can lay down, no retractions, pulse < 100.    - MODERATE: SOB at rest, SOB with minimal exertion and prefers to sit, cannot lie down flat, speaks in phrases, mild retractions, audible wheezing, pulse 100-120.    - SEVERE: Very SOB at rest, speaks in single words, struggling to breathe, sitting hunched forward, retractions, pulse > 120      Moderate denies fast pulse 5. RECURRENT SYMPTOM: "Have you had difficulty breathing before?" If Yes, ask: "When was the last time?" and "What happened that time?"      No  6. CARDIAC HISTORY: "Do you have any history of heart disease?" (e.g., heart attack, angina, bypass surgery, angioplasty)      na 7. LUNG HISTORY: "Do you have any history of lung disease?"  (e.g., pulmonary embolus, asthma, emphysema)     na 8. CAUSE: "What do you think is causing the breathing problem?"      Not sure   9. OTHER SYMPTOMS: "Do you have any other symptoms? (e.g., dizziness, runny nose, cough, chest pain, fever)     Chest pain pressure, nausea, vomitied x 1 headache, lightheaded, dizzy  10. PREGNANCY: "Is there any chance you are pregnant?" "When was your last menstrual period?"       33 weeks and 2 days pregnant  11. TRAVEL: "Have you traveled out of the country in the last month?" (e.g., travel history, exposures)       na  Protocols used: BREATHING DIFFICULTY-A-AH

## 2019-12-26 NOTE — Discharge Summary (Signed)
    L&D OB Triage Note  SUBJECTIVE Diana Kirby is a 29 y.o. G85P2002 female at [redacted]w[redacted]d, EDD Estimated Date of Delivery: 02/10/20 who presented to triage with complaints of headache and abdominal pain/contractions.   OB History  Gravida Para Term Preterm AB Living  3 2 2  0 0 2  SAB TAB Ectopic Multiple Live Births  0 0 0 0 2    # Outcome Date GA Lbr Len/2nd Weight Sex Delivery Anes PTL Lv  3 Current           2 Term 07/13/11 [redacted]w[redacted]d  3345 g F Vag-Spont  N LIV  1 Term 12/13/09 [redacted]w[redacted]d  3799 g M Vag-Spont EPI N LIV    No medications prior to admission.     OBJECTIVE  Nursing Evaluation:   BP (!) 104/56   Pulse 67   Temp 98.8 F (37.1 C) (Oral)   Resp 18   Ht 5\' 2"  (1.575 m)   Wt 91.1 kg   LMP 05/06/2019 (Exact Date)   SpO2 100%   BMI 36.75 kg/m    Findings:        Contractions resolved with hydration     Headache improved with Tylenol -not completely gone but patient wants to go home.     Normal blood pressure      Covid negative     Not in labor      NST was performed and has been reviewed by me.  NST INTERPRETATION: Category I  Mode: External Baseline Rate (A): 135 bpm Variability: Moderate Accelerations: 15 x 15 Decelerations: None     Contraction Frequency (min): rare  ASSESSMENT Impression:  1.  Pregnancy:  at [redacted]w[redacted]d , EDD Estimated Date of Delivery: 02/10/20 2.  Reassuring fetal and maternal status   PLAN 1. Discussed current condition and above findings with patient and reassurance given.  All questions answered. 2. Discharge home with standard labor precautions given to return to L&D or call the office for problems. 3. Continue routine prenatal care.

## 2019-12-28 NOTE — Telephone Encounter (Signed)
LMTRC- MOM  Pts cell- VM full unable to leave message.   Pt was seen in [facility] on 9/17.

## 2019-12-29 ENCOUNTER — Other Ambulatory Visit: Payer: Self-pay

## 2019-12-29 ENCOUNTER — Ambulatory Visit (INDEPENDENT_AMBULATORY_CARE_PROVIDER_SITE_OTHER): Payer: Medicaid Other | Admitting: Obstetrics and Gynecology

## 2019-12-29 VITALS — BP 84/54 | HR 89 | Wt 204.0 lb

## 2019-12-29 DIAGNOSIS — Z3A33 33 weeks gestation of pregnancy: Secondary | ICD-10-CM

## 2019-12-29 DIAGNOSIS — Z3493 Encounter for supervision of normal pregnancy, unspecified, third trimester: Secondary | ICD-10-CM

## 2019-12-29 NOTE — Progress Notes (Signed)
ROB: Doing well.  Reports occasional Braxton Hicks contractions.  Active daily fetal movement.  GC/CT-GBS next visit

## 2020-01-06 NOTE — Telephone Encounter (Signed)
Pt states she is doing better.   Reminded her of her appt on 10/6.

## 2020-01-12 NOTE — Progress Notes (Signed)
ROB-Pt present for routine prenatal care and 36 week cultures. Pt c/o of lower back/abd pain, braxton hick contractions and vaginal pressure.

## 2020-01-12 NOTE — Patient Instructions (Signed)
Common Medications Safe in Pregnancy  Acne:      Constipation:  Benzoyl Peroxide     Colace  Clindamycin      Dulcolax Suppository  Topica Erythromycin     Fibercon  Salicylic Acid      Metamucil         Miralax AVOID:        Senakot   Accutane    Cough:  Retin-A       Cough Drops  Tetracycline      Phenergan w/ Codeine if Rx  Minocycline      Robitussin (Plain & DM)  Antibiotics:     Crabs/Lice:  Ceclor       RID  Cephalosporins    AVOID:  E-Mycins      Kwell  Keflex  Macrobid/Macrodantin   Diarrhea:  Penicillin      Kao-Pectate  Zithromax      Imodium AD         PUSH FLUIDS AVOID:       Cipro     Fever:  Tetracycline      Tylenol (Regular or Extra  Minocycline       Strength)  Levaquin      Extra Strength-Do not          Exceed 8 tabs/24 hrs Caffeine:        <200mg/day (equiv. To 1 cup of coffee or  approx. 3 12 oz sodas)         Gas: Cold/Hayfever:       Gas-X  Benadryl      Mylicon  Claritin       Phazyme  **Claritin-D        Chlor-Trimeton    Headaches:  Dimetapp      ASA-Free Excedrin  Drixoral-Non-Drowsy     Cold Compress  Mucinex (Guaifenasin)     Tylenol (Regular or Extra  Sudafed/Sudafed-12 Hour     Strength)  **Sudafed PE Pseudoephedrine   Tylenol Cold & Sinus     Vicks Vapor Rub  Zyrtec  **AVOID if Problems With Blood Pressure         Heartburn: Avoid lying down for at least 1 hour after meals  Aciphex      Maalox     Rash:  Milk of Magnesia     Benadryl    Mylanta       1% Hydrocortisone Cream  Pepcid  Pepcid Complete   Sleep Aids:  Prevacid      Ambien   Prilosec       Benadryl  Rolaids       Chamomile Tea  Tums (Limit 4/day)     Unisom         Tylenol PM         Warm milk-add vanilla or  Hemorrhoids:       Sugar for taste  Anusol/Anusol H.C.  (RX: Analapram 2.5%)  Sugar Substitutes:  Hydrocortisone OTC     Ok in moderation  Preparation H      Tucks        Vaseline lotion applied to tissue with  wiping    Herpes:     Throat:  Acyclovir      Oragel  Famvir  Valtrex     Vaccines:         Flu Shot Leg Cramps:       *Gardasil  Benadryl      Hepatitis A         Hepatitis B Nasal Spray:         Pneumovax  Saline Nasal Spray     Polio Booster         Tetanus Nausea:       Tuberculosis test or PPD  Vitamin B6 25 mg TID   AVOID:    Dramamine      *Gardasil  Emetrol       Live Poliovirus  Ginger Root 250 mg QID    MMR (measles, mumps &  High Complex Carbs @ Bedtime    rebella)  Sea Bands-Accupressure    Varicella (Chickenpox)  Unisom 1/2 tab TID     *No known complications           If received before Pain:         Known pregnancy;   Darvocet       Resume series after  Lortab        Delivery  Percocet    Yeast:   Tramadol      Femstat  Tylenol 3      Gyne-lotrimin  Ultram       Monistat  Vicodin           MISC:         All Sunscreens           Hair Coloring/highlights          Insect Repellant's          (Including DEET)         Mystic Tans Breastfeeding  Choosing to breastfeed is one of the best decisions you can make for yourself and your baby. A change in hormones during pregnancy causes your breasts to make breast milk in your milk-producing glands. Hormones prevent breast milk from being released before your baby is born. They also prompt milk flow after birth. Once breastfeeding has begun, thoughts of your baby, as well as his or her sucking or crying, can stimulate the release of milk from your milk-producing glands. Benefits of breastfeeding Research shows that breastfeeding offers many health benefits for infants and mothers. It also offers a cost-free and convenient way to feed your baby. For your baby  Your first milk (colostrum) helps your baby's digestive system to function better.  Special cells in your milk (antibodies) help your baby to fight off infections.  Breastfed babies are less likely to develop asthma, allergies, obesity, or type 2 diabetes. They  are also at lower risk for sudden infant death syndrome (SIDS).  Nutrients in breast milk are better able to meet your baby's needs compared to infant formula.  Breast milk improves your baby's brain development. For you  Breastfeeding helps to create a very special bond between you and your baby.  Breastfeeding is convenient. Breast milk costs nothing and is always available at the correct temperature.  Breastfeeding helps to burn calories. It helps you to lose the weight that you gained during pregnancy.  Breastfeeding makes your uterus return faster to its size before pregnancy. It also slows bleeding (lochia) after you give birth.  Breastfeeding helps to lower your risk of developing type 2 diabetes, osteoporosis, rheumatoid arthritis, cardiovascular disease, and breast, ovarian, uterine, and endometrial cancer later in life. Breastfeeding basics Starting breastfeeding  Find a comfortable place to sit or lie down, with your neck and back well-supported.  Place a pillow or a rolled-up blanket under your baby to bring him or her to the level of your breast (if you are seated). Nursing pillows are specially designed to help support your arms and your baby while you   breastfeed.  Make sure that your baby's tummy (abdomen) is facing your abdomen.  Gently massage your breast. With your fingertips, massage from the outer edges of your breast inward toward the nipple. This encourages milk flow. If your milk flows slowly, you may need to continue this action during the feeding.  Support your breast with 4 fingers underneath and your thumb above your nipple (make the letter "C" with your hand). Make sure your fingers are well away from your nipple and your baby's mouth.  Stroke your baby's lips gently with your finger or nipple.  When your baby's mouth is open wide enough, quickly bring your baby to your breast, placing your entire nipple and as much of the areola as possible into your baby's  mouth. The areola is the colored area around your nipple. ? More areola should be visible above your baby's upper lip than below the lower lip. ? Your baby's lips should be opened and extended outward (flanged) to ensure an adequate, comfortable latch. ? Your baby's tongue should be between his or her lower gum and your breast.  Make sure that your baby's mouth is correctly positioned around your nipple (latched). Your baby's lips should create a seal on your breast and be turned out (everted).  It is common for your baby to suck about 2-3 minutes in order to start the flow of breast milk. Latching Teaching your baby how to latch onto your breast properly is very important. An improper latch can cause nipple pain, decreased milk supply, and poor weight gain in your baby. Also, if your baby is not latched onto your nipple properly, he or she may swallow some air during feeding. This can make your baby fussy. Burping your baby when you switch breasts during the feeding can help to get rid of the air. However, teaching your baby to latch on properly is still the best way to prevent fussiness from swallowing air while breastfeeding. Signs that your baby has successfully latched onto your nipple  Silent tugging or silent sucking, without causing you pain. Infant's lips should be extended outward (flanged).  Swallowing heard between every 3-4 sucks once your milk has started to flow (after your let-down milk reflex occurs).  Muscle movement above and in front of his or her ears while sucking. Signs that your baby has not successfully latched onto your nipple  Sucking sounds or smacking sounds from your baby while breastfeeding.  Nipple pain. If you think your baby has not latched on correctly, slip your finger into the corner of your baby's mouth to break the suction and place it between your baby's gums. Attempt to start breastfeeding again. Signs of successful breastfeeding Signs from your  baby  Your baby will gradually decrease the number of sucks or will completely stop sucking.  Your baby will fall asleep.  Your baby's body will relax.  Your baby will retain a small amount of milk in his or her mouth.  Your baby will let go of your breast by himself or herself. Signs from you  Breasts that have increased in firmness, weight, and size 1-3 hours after feeding.  Breasts that are softer immediately after breastfeeding.  Increased milk volume, as well as a change in milk consistency and color by the fifth day of breastfeeding.  Nipples that are not sore, cracked, or bleeding. Signs that your baby is getting enough milk  Wetting at least 1-2 diapers during the first 24 hours after birth.  Wetting at least 5-6  diapers every 24 hours for the first week after birth. The urine should be clear or pale yellow by the age of 5 days.  Wetting 6-8 diapers every 24 hours as your baby continues to grow and develop.  At least 3 stools in a 24-hour period by the age of 5 days. The stool should be soft and yellow.  At least 3 stools in a 24-hour period by the age of 7 days. The stool should be seedy and yellow.  No loss of weight greater than 10% of birth weight during the first 3 days of life.  Average weight gain of 4-7 oz (113-198 g) per week after the age of 4 days.  Consistent daily weight gain by the age of 5 days, without weight loss after the age of 2 weeks. After a feeding, your baby may spit up a small amount of milk. This is normal. Breastfeeding frequency and duration Frequent feeding will help you make more milk and can prevent sore nipples and extremely full breasts (breast engorgement). Breastfeed when you feel the need to reduce the fullness of your breasts or when your baby shows signs of hunger. This is called "breastfeeding on demand." Signs that your baby is hungry include:  Increased alertness, activity, or restlessness.  Movement of the head from side to  side.  Opening of the mouth when the corner of the mouth or cheek is stroked (rooting).  Increased sucking sounds, smacking lips, cooing, sighing, or squeaking.  Hand-to-mouth movements and sucking on fingers or hands.  Fussing or crying. Avoid introducing a pacifier to your baby in the first 4-6 weeks after your baby is born. After this time, you may choose to use a pacifier. Research has shown that pacifier use during the first year of a baby's life decreases the risk of sudden infant death syndrome (SIDS). Allow your baby to feed on each breast as long as he or she wants. When your baby unlatches or falls asleep while feeding from the first breast, offer the second breast. Because newborns are often sleepy in the first few weeks of life, you may need to awaken your baby to get him or her to feed. Breastfeeding times will vary from baby to baby. However, the following rules can serve as a guide to help you make sure that your baby is properly fed:  Newborns (babies 69 weeks of age or younger) may breastfeed every 1-3 hours.  Newborns should not go without breastfeeding for longer than 3 hours during the day or 5 hours during the night.  You should breastfeed your baby a minimum of 8 times in a 24-hour period. Breast milk pumping     Pumping and storing breast milk allows you to make sure that your baby is exclusively fed your breast milk, even at times when you are unable to breastfeed. This is especially important if you go back to work while you are still breastfeeding, or if you are not able to be present during feedings. Your lactation consultant can help you find a method of pumping that works best for you and give you guidelines about how long it is safe to store breast milk. Caring for your breasts while you breastfeed Nipples can become dry, cracked, and sore while breastfeeding. The following recommendations can help keep your breasts moisturized and healthy:  Avoid using soap on  your nipples.  Wear a supportive bra designed especially for nursing. Avoid wearing underwire-style bras or extremely tight bras (sports bras).  Air-dry your  nipples for 3-4 minutes after each feeding.  Use only cotton bra pads to absorb leaked breast milk. Leaking of breast milk between feedings is normal.  Use lanolin on your nipples after breastfeeding. Lanolin helps to maintain your skin's normal moisture barrier. Pure lanolin is not harmful (not toxic) to your baby. You may also hand express a few drops of breast milk and gently massage that milk into your nipples and allow the milk to air-dry. In the first few weeks after giving birth, some women experience breast engorgement. Engorgement can make your breasts feel heavy, warm, and tender to the touch. Engorgement peaks within 3-5 days after you give birth. The following recommendations can help to ease engorgement:  Completely empty your breasts while breastfeeding or pumping. You may want to start by applying warm, moist heat (in the shower or with warm, water-soaked hand towels) just before feeding or pumping. This increases circulation and helps the milk flow. If your baby does not completely empty your breasts while breastfeeding, pump any extra milk after he or she is finished.  Apply ice packs to your breasts immediately after breastfeeding or pumping, unless this is too uncomfortable for you. To do this: ? Put ice in a plastic bag. ? Place a towel between your skin and the bag. ? Leave the ice on for 20 minutes, 2-3 times a day.  Make sure that your baby is latched on and positioned properly while breastfeeding. If engorgement persists after 48 hours of following these recommendations, contact your health care provider or a Science writer. Overall health care recommendations while breastfeeding  Eat 3 healthy meals and 3 snacks every day. Well-nourished mothers who are breastfeeding need an additional 450-500 calories a day.  You can meet this requirement by increasing the amount of a balanced diet that you eat.  Drink enough water to keep your urine pale yellow or clear.  Rest often, relax, and continue to take your prenatal vitamins to prevent fatigue, stress, and low vitamin and mineral levels in your body (nutrient deficiencies).  Do not use any products that contain nicotine or tobacco, such as cigarettes and e-cigarettes. Your baby may be harmed by chemicals from cigarettes that pass into breast milk and exposure to secondhand smoke. If you need help quitting, ask your health care provider.  Avoid alcohol.  Do not use illegal drugs or marijuana.  Talk with your health care provider before taking any medicines. These include over-the-counter and prescription medicines as well as vitamins and herbal supplements. Some medicines that may be harmful to your baby can pass through breast milk.  It is possible to become pregnant while breastfeeding. If birth control is desired, ask your health care provider about options that will be safe while breastfeeding your baby. Where to find more information: [employer]: www.llli.org Contact a health care provider if:  You feel like you want to stop breastfeeding or have become frustrated with breastfeeding.  Your nipples are cracked or bleeding.  Your breasts are red, tender, or warm.  You have: ? Painful breasts or nipples. ? A swollen area on either breast. ? A fever or chills. ? Nausea or vomiting. ? Drainage other than breast milk from your nipples.  Your breasts do not become full before feedings by the fifth day after you give birth.  You feel sad and depressed.  Your baby is: ? Too sleepy to eat well. ? Having trouble sleeping. ? More than 45 week old and wetting fewer than 6  diapers in a 24-hour period. ? Not gaining weight by 79 days of age.  Your baby has fewer than 3 stools in a 24-hour period.  Your baby's skin or the white  parts of his or her eyes become yellow. Get help right away if:  Your baby is overly tired (lethargic) and does not want to wake up and feed.  Your baby develops an unexplained fever. Summary  Breastfeeding offers many health benefits for infant and mothers.  Try to breastfeed your infant when he or she shows early signs of hunger.  Gently tickle or stroke your baby's lips with your finger or nipple to allow the baby to open his or her mouth. Bring the baby to your breast. Make sure that much of the areola is in your baby's mouth. Offer one side and burp the baby before you offer the other side.  Talk with your health care provider or lactation consultant if you have questions or you face problems as you breastfeed. This information is not intended to replace advice given to you by your health care provider. Make sure you discuss any questions you have with your health care provider. Document Revised: 06/20/2017 Document Reviewed: 04/27/2016 Elsevier Patient Education  [place].

## 2020-01-13 ENCOUNTER — Encounter: Payer: Self-pay | Admitting: Obstetrics and Gynecology

## 2020-01-13 ENCOUNTER — Ambulatory Visit (INDEPENDENT_AMBULATORY_CARE_PROVIDER_SITE_OTHER): Payer: Medicaid Other | Admitting: Obstetrics and Gynecology

## 2020-01-13 ENCOUNTER — Other Ambulatory Visit: Payer: Self-pay

## 2020-01-13 VITALS — BP 111/76 | HR 92 | Wt 204.8 lb

## 2020-01-13 DIAGNOSIS — Z8759 Personal history of other complications of pregnancy, childbirth and the puerperium: Secondary | ICD-10-CM | POA: Insufficient documentation

## 2020-01-13 DIAGNOSIS — Z23 Encounter for immunization: Secondary | ICD-10-CM | POA: Diagnosis not present

## 2020-01-13 DIAGNOSIS — Z3A36 36 weeks gestation of pregnancy: Secondary | ICD-10-CM

## 2020-01-13 DIAGNOSIS — Z3483 Encounter for supervision of other normal pregnancy, third trimester: Secondary | ICD-10-CM

## 2020-01-13 NOTE — Progress Notes (Signed)
ROB: Patient with complaints of pelvic pressure and low back pain, Braxton Hicks ctx.  Understands this is normal for this stage in pregnancy. Discussion of labor, patient does note h/o precipitous delivery with her second child (< 3 hours). 36 week labs done. RTC in 1 week.

## 2020-01-15 LAB — STREP GP B NAA+RFLX: Strep Gp B NAA+Rflx: NEGATIVE

## 2020-01-17 LAB — GC/CHLAMYDIA PROBE AMP
Chlamydia trachomatis, NAA: NEGATIVE
Neisseria Gonorrhoeae by PCR: NEGATIVE

## 2020-01-22 ENCOUNTER — Other Ambulatory Visit: Payer: Self-pay

## 2020-01-22 ENCOUNTER — Ambulatory Visit (INDEPENDENT_AMBULATORY_CARE_PROVIDER_SITE_OTHER): Payer: Medicaid Other | Admitting: Obstetrics and Gynecology

## 2020-01-22 ENCOUNTER — Encounter: Payer: Self-pay | Admitting: Obstetrics and Gynecology

## 2020-01-22 VITALS — BP 124/74 | HR 97 | Wt 210.7 lb

## 2020-01-22 DIAGNOSIS — Z3A37 37 weeks gestation of pregnancy: Secondary | ICD-10-CM

## 2020-01-22 DIAGNOSIS — Z3483 Encounter for supervision of other normal pregnancy, third trimester: Secondary | ICD-10-CM

## 2020-01-22 LAB — POCT URINALYSIS DIPSTICK OB
Bilirubin, UA: NEGATIVE
Blood, UA: NEGATIVE
Glucose, UA: NEGATIVE
Ketones, UA: NEGATIVE
Nitrite, UA: NEGATIVE
Spec Grav, UA: >=1.03 — AB (ref 1.010–1.025)
Urobilinogen, UA: 0.2 E.U./dL
pH, UA: 8 (ref 5.0–8.0)

## 2020-01-22 NOTE — Progress Notes (Signed)
ROB: Notes pelvic pressure, low back pain. Discussed comfort measures. 36 week cultures negative. RTC in 1 week.

## 2020-01-22 NOTE — Progress Notes (Signed)
ROB-Pt present for routine prenatal care. Pt c/o of lower/back and abd pain, pelvic pain and loose stools.

## 2020-01-22 NOTE — Addendum Note (Signed)
Addended by: Silvano Bilis on: 01/22/2020 09:57 AM   Modules accepted: Orders

## 2020-01-28 ENCOUNTER — Encounter: Payer: Self-pay | Admitting: Obstetrics and Gynecology

## 2020-01-28 ENCOUNTER — Other Ambulatory Visit: Payer: Self-pay

## 2020-01-28 ENCOUNTER — Ambulatory Visit (INDEPENDENT_AMBULATORY_CARE_PROVIDER_SITE_OTHER): Payer: Medicaid Other | Admitting: Obstetrics and Gynecology

## 2020-01-28 ENCOUNTER — Telehealth: Payer: Self-pay

## 2020-01-28 VITALS — BP 122/84 | HR 91 | Wt 209.9 lb

## 2020-01-28 DIAGNOSIS — Z3483 Encounter for supervision of other normal pregnancy, third trimester: Secondary | ICD-10-CM

## 2020-01-28 DIAGNOSIS — Z3A38 38 weeks gestation of pregnancy: Secondary | ICD-10-CM

## 2020-01-28 NOTE — Progress Notes (Signed)
ROB: General aches and pains of pregnancy.  Has occasional contractions but not regular.  Signs and symptoms of labor discussed.

## 2020-01-28 NOTE — Telephone Encounter (Signed)
Pt was checking out and her dispo says 1 week (around 02/04/2020) for [facility]. Where would you like for me to put her

## 2020-01-29 NOTE — Telephone Encounter (Signed)
She will need to be pushed out to the following Tuesday

## 2020-01-29 NOTE — Telephone Encounter (Addendum)
Called pt to let know I spoke to Dr. Valentino Saxon. I told the pt that Dr. Valentino Saxon said it was find to push it to the following Tuesday. We made the appt for 11/2

## 2020-02-02 ENCOUNTER — Ambulatory Visit (INDEPENDENT_AMBULATORY_CARE_PROVIDER_SITE_OTHER): Payer: Medicaid Other | Admitting: Obstetrics and Gynecology

## 2020-02-02 ENCOUNTER — Encounter: Payer: Self-pay | Admitting: Obstetrics and Gynecology

## 2020-02-02 ENCOUNTER — Telehealth: Payer: Self-pay

## 2020-02-02 ENCOUNTER — Other Ambulatory Visit: Payer: Self-pay

## 2020-02-02 VITALS — BP 122/82 | HR 89 | Wt 214.5 lb

## 2020-02-02 DIAGNOSIS — Z3A38 38 weeks gestation of pregnancy: Secondary | ICD-10-CM

## 2020-02-02 DIAGNOSIS — Z3483 Encounter for supervision of other normal pregnancy, third trimester: Secondary | ICD-10-CM

## 2020-02-02 LAB — POCT URINALYSIS DIPSTICK OB
Bilirubin, UA: NEGATIVE
Glucose, UA: NEGATIVE
Leukocytes, UA: NEGATIVE
Nitrite, UA: NEGATIVE
Spec Grav, UA: 1.015 (ref 1.010–1.025)
Urobilinogen, UA: 0.2 E.U./dL
pH, UA: 6 (ref 5.0–8.0)

## 2020-02-02 NOTE — Progress Notes (Signed)
ROB: Patient called the office today complaining of increased swelling in her feet, a funny feeling in her hands, and headache.  She was asked to come to the office for evaluation.  Normal blood pressure noted in the office.  2+ edema of lower extremities noted.  5 pound weight gain in 5 days.  Small proteinuria.  Patient states her headache is still present but not " bad".  She has not tried Tylenol.  Bilateral lower extremity reflexes +3 negative clonus.  Possible early preeclampsia with normal blood pressure.  Lab work ordered.  Will contact patient if necessary.  Patient to rest at home keep feet elevated clear liquids.  Symptoms of preeclampsia discussed.

## 2020-02-02 NOTE — Telephone Encounter (Signed)
Scheduled patient to come in today to be seen by Dr. Logan Bores.

## 2020-02-02 NOTE — Telephone Encounter (Signed)
Birth place [facility] called and this pt called them and is experiencing swelling in her legs and feet. The pt is having headaches and her legs and feet are tingling. I told her I would send a message to the nurse . Please advise the pt is requesting a call back

## 2020-02-03 LAB — CBC WITH DIFFERENTIAL/PLATELET
Basophils Absolute: 0 10*3/uL (ref 0.0–0.2)
Basos: 0 %
EOS (ABSOLUTE): 0 10*3/uL (ref 0.0–0.4)
Eos: 0 %
Hematocrit: 32.5 % — ABNORMAL LOW (ref 34.0–46.6)
Hemoglobin: 11.1 g/dL (ref 11.1–15.9)
Immature Grans (Abs): 0 10*3/uL (ref 0.0–0.1)
Immature Granulocytes: 1 %
Lymphocytes Absolute: 1.8 10*3/uL (ref 0.7–3.1)
Lymphs: 25 %
MCH: 29.1 pg (ref 26.6–33.0)
MCHC: 34.2 g/dL (ref 31.5–35.7)
MCV: 85 fL (ref 79–97)
Monocytes Absolute: 0.7 10*3/uL (ref 0.1–0.9)
Monocytes: 10 %
Neutrophils Absolute: 4.6 10*3/uL (ref 1.4–7.0)
Neutrophils: 64 %
Platelets: 159 10*3/uL (ref 150–450)
RBC: 3.81 x10E6/uL (ref 3.77–5.28)
RDW: 12.9 % (ref 11.7–15.4)
WBC: 7.1 10*3/uL (ref 3.4–10.8)

## 2020-02-03 LAB — COMPREHENSIVE METABOLIC PANEL
ALT: 5 IU/L (ref 0–32)
AST: 12 IU/L (ref 0–40)
Albumin/Globulin Ratio: 1.6 (ref 1.2–2.2)
Albumin: 3.5 g/dL — ABNORMAL LOW (ref 3.9–5.0)
Alkaline Phosphatase: 122 IU/L — ABNORMAL HIGH (ref 44–121)
BUN/Creatinine Ratio: 17 (ref 9–23)
BUN: 9 mg/dL (ref 6–20)
Bilirubin Total: 0.8 mg/dL (ref 0.0–1.2)
CO2: 21 mmol/L (ref 20–29)
Calcium: 8 mg/dL — ABNORMAL LOW (ref 8.7–10.2)
Chloride: 105 mmol/L (ref 96–106)
Creatinine, Ser: 0.53 mg/dL — ABNORMAL LOW (ref 0.57–1.00)
GFR calc Af Amer: 149 mL/min/{1.73_m2} (ref >59)
GFR calc non Af Amer: 130 mL/min/{1.73_m2} (ref >59)
Globulin, Total: 2.2 g/dL (ref 1.5–4.5)
Glucose: 82 mg/dL (ref 65–99)
Potassium: 3.6 mmol/L (ref 3.5–5.2)
Sodium: 140 mmol/L (ref 134–144)
Total Protein: 5.7 g/dL — ABNORMAL LOW (ref 6.0–8.5)

## 2020-02-03 LAB — PROTEIN / CREATININE RATIO, URINE
Creatinine, Urine: 217.2 mg/dL
Protein, Ur: 60.3 mg/dL
Protein/Creat Ratio: 278 mg/g creat — ABNORMAL HIGH (ref 0–200)

## 2020-02-04 ENCOUNTER — Telehealth: Payer: Self-pay

## 2020-02-04 NOTE — Telephone Encounter (Signed)
Please advise on results

## 2020-02-04 NOTE — Telephone Encounter (Signed)
Patient called in stating that she was supposed to get a phone call yesterday regarding some test results however she has not heard anything and would like a call back from the nurse. Could you please advise?

## 2020-02-08 ENCOUNTER — Observation Stay (HOSPITAL_BASED_OUTPATIENT_CLINIC_OR_DEPARTMENT_OTHER)
Admission: EM | Admit: 2020-02-08 | Discharge: 2020-02-08 | Disposition: A | Discharge status: Home / Self Care | Payer: Medicaid Other | Source: Home / Self Care | Admitting: Obstetrics and Gynecology

## 2020-02-08 ENCOUNTER — Other Ambulatory Visit: Payer: Self-pay

## 2020-02-08 ENCOUNTER — Encounter: Payer: Self-pay | Admitting: Obstetrics and Gynecology

## 2020-02-08 DIAGNOSIS — Z3490 Encounter for supervision of normal pregnancy, unspecified, unspecified trimester: Secondary | ICD-10-CM

## 2020-02-08 DIAGNOSIS — R197 Diarrhea, unspecified: Secondary | ICD-10-CM | POA: Insufficient documentation

## 2020-02-08 DIAGNOSIS — Z3A39 39 weeks gestation of pregnancy: Secondary | ICD-10-CM

## 2020-02-08 LAB — URINALYSIS, COMPLETE (UACMP) WITH MICROSCOPIC
Bacteria, UA: NONE SEEN
Bilirubin Urine: NEGATIVE
Glucose, UA: NEGATIVE mg/dL
Hgb urine dipstick: NEGATIVE
Ketones, ur: 20 mg/dL — AB
Nitrite: NEGATIVE
Protein, ur: 100 mg/dL — AB
Specific Gravity, Urine: 1.029 (ref 1.005–1.030)
pH: 5 (ref 5.0–8.0)

## 2020-02-08 MED ORDER — LACTATED RINGERS IV SOLN: INTRAVENOUS | Status: DC

## 2020-02-08 MED ORDER — ZOLPIDEM TARTRATE 5 MG PO TABS
5.0000 mg | ORAL_TABLET | Freq: Once | ORAL | Status: AC
  Administered 2020-02-08: 5 mg via ORAL
  Filled 2020-02-08: qty 1

## 2020-02-08 MED ORDER — LACTATED RINGERS IV BOLUS
1000.0000 mL | Freq: Once | INTRAVENOUS | Status: AC
  Administered 2020-02-08: 1000 mL via INTRAVENOUS

## 2020-02-08 NOTE — Progress Notes (Signed)
MD spoke to RN at 1739 regarding FHR variability and ordered patient to get juice and graham crackers. RN gave patient juice and graham crackers. RN went to bedside at 1745 and patient was much more uncomfortable with contractions. Craige Cotta, RN checked patient and was unable to reach cervix well due to cervix being posterior and patient uncomfortable during exam (same exam as 1430). RN called MD at 1752 to report cervical exam and patient requesting pain medication. MD stated not to give patient pain medication at this time and to do a labor evaluation on the patient.

## 2020-02-08 NOTE — Progress Notes (Signed)
ROB-Pt present for routine prenatal care. Pt stated having contractions since yesterday. Pt c/o of strong painful contractions and freq urination.

## 2020-02-08 NOTE — Discharge Summary (Signed)
RN to review discharge instructions with patient. Gave patient opportunity for questions. Pt had no questions at this time. Pt verbalized understanding. Pt discharged home with significant other and will follow up with provider tomorrow at the clinic.

## 2020-02-08 NOTE — Progress Notes (Signed)
RN notified MD of prolonged deceleration into the 50s that resolved with position changes. RN also reported results of urinalysis and MD gave verbal order for patient to receive IV fluid bolus and order to continue monitoring patient. MD states he will review the FHR strip.

## 2020-02-08 NOTE — OB Triage Note (Signed)
Patient presented to L&D for evaluation with complaints of decreased fetal movement since yesterday.  Reports having diarrhea for about 2 days and concerned about possibly being dehydrated. Denies vomiting.  States she started having contractions last night which continued into today.  Denies leaking of fluid or vaginal bleeding.

## 2020-02-08 NOTE — Progress Notes (Signed)
RN notified MD at 1556 about prolonged deceleration (broken up FHR strip; but RN noted baby in 67s at bedside). Notified of IV fluid bolus infusing and position change to bring baby's heart rate back up to baseline. Notified of difficulty tracing contractions. No new orders were given at this time. MD states plan is to continue to monitor FHR.

## 2020-02-09 ENCOUNTER — Inpatient Hospital Stay
Admission: EM | Admit: 2020-02-09 | Discharge: 2020-02-11 | DRG: 807 | Disposition: A | Discharge status: Home / Self Care | Payer: Medicaid Other | Attending: Obstetrics and Gynecology | Admitting: Obstetrics and Gynecology

## 2020-02-09 ENCOUNTER — Inpatient Hospital Stay: Payer: Medicaid Other | Admitting: Anesthesiology

## 2020-02-09 ENCOUNTER — Other Ambulatory Visit: Payer: Self-pay

## 2020-02-09 ENCOUNTER — Encounter: Payer: Self-pay | Admitting: Obstetrics and Gynecology

## 2020-02-09 ENCOUNTER — Ambulatory Visit (INDEPENDENT_AMBULATORY_CARE_PROVIDER_SITE_OTHER): Payer: Medicaid Other | Admitting: Obstetrics and Gynecology

## 2020-02-09 VITALS — BP 137/81 | HR 87 | Wt 220.6 lb

## 2020-02-09 DIAGNOSIS — Z3A39 39 weeks gestation of pregnancy: Secondary | ICD-10-CM

## 2020-02-09 DIAGNOSIS — Z20822 Contact with and (suspected) exposure to covid-19: Secondary | ICD-10-CM | POA: Diagnosis present

## 2020-02-09 DIAGNOSIS — R197 Diarrhea, unspecified: Secondary | ICD-10-CM | POA: Diagnosis present

## 2020-02-09 DIAGNOSIS — O26893 Other specified pregnancy related conditions, third trimester: Secondary | ICD-10-CM | POA: Diagnosis present

## 2020-02-09 DIAGNOSIS — Z3483 Encounter for supervision of other normal pregnancy, third trimester: Secondary | ICD-10-CM

## 2020-02-09 LAB — TYPE AND SCREEN
ABO/RH(D): B POS
Antibody Screen: NEGATIVE

## 2020-02-09 LAB — POCT URINALYSIS DIPSTICK OB
Bilirubin, UA: NEGATIVE
Glucose, UA: NEGATIVE
Nitrite, UA: NEGATIVE
Spec Grav, UA: 1.02 (ref 1.010–1.025)
Urobilinogen, UA: 0.2 E.U./dL
pH, UA: 6.5 (ref 5.0–8.0)

## 2020-02-09 LAB — CBC
HCT: 32.7 % — ABNORMAL LOW (ref 36.0–46.0)
Hemoglobin: 10.6 g/dL — ABNORMAL LOW (ref 12.0–15.0)
MCH: 28.4 pg (ref 26.0–34.0)
MCHC: 32.4 g/dL (ref 30.0–36.0)
MCV: 87.7 fL (ref 80.0–100.0)
Platelets: 169 10*3/uL (ref 150–400)
RBC: 3.73 MIL/uL — ABNORMAL LOW (ref 3.87–5.11)
RDW: 13.9 % (ref 11.5–15.5)
WBC: 8.3 10*3/uL (ref 4.0–10.5)
nRBC: 0 % (ref 0.0–0.2)

## 2020-02-09 LAB — RESPIRATORY PANEL BY RT PCR (FLU A&B, COVID)
Influenza A by PCR: NEGATIVE
Influenza B by PCR: NEGATIVE
SARS Coronavirus 2 by RT PCR: NEGATIVE

## 2020-02-09 LAB — ABO/RH: ABO/RH(D): B POS

## 2020-02-09 MED ORDER — ACETAMINOPHEN 325 MG PO TABS
650.0000 mg | ORAL_TABLET | ORAL | Status: DC | PRN
  Administered 2020-02-10 – 2020-02-11 (×2): 650 mg via ORAL
  Filled 2020-02-09 (×2): qty 2

## 2020-02-09 MED ORDER — DIPHENHYDRAMINE HCL 25 MG PO CAPS: 25.0000 mg | ORAL_CAPSULE | Freq: Four times a day (QID) | ORAL | Status: DC | PRN

## 2020-02-09 MED ORDER — LIDOCAINE HCL (PF) 1 % IJ SOLN
INTRAMUSCULAR | Status: AC
  Filled 2020-02-09: qty 30

## 2020-02-09 MED ORDER — PHENYLEPHRINE 40 MCG/ML (10ML) SYRINGE FOR IV PUSH (FOR BLOOD PRESSURE SUPPORT): 80.0000 ug | PREFILLED_SYRINGE | INTRAVENOUS | Status: DC | PRN

## 2020-02-09 MED ORDER — BENZOCAINE-MENTHOL 20-0.5 % EX AERO
1.0000 "application " | INHALATION_SPRAY | CUTANEOUS | Status: DC | PRN
  Administered 2020-02-09: 1 via TOPICAL
  Filled 2020-02-09: qty 56

## 2020-02-09 MED ORDER — LIDOCAINE HCL (PF) 1 % IJ SOLN
INTRAMUSCULAR | Status: DC | PRN
  Administered 2020-02-09: 3 mL via SUBCUTANEOUS

## 2020-02-09 MED ORDER — SIMETHICONE 80 MG PO CHEW
80.0000 mg | CHEWABLE_TABLET | ORAL | Status: DC | PRN
  Administered 2020-02-11: 80 mg via ORAL
  Filled 2020-02-09: qty 1

## 2020-02-09 MED ORDER — OXYTOCIN-SODIUM CHLORIDE 30-0.9 UT/500ML-% IV SOLN
2.5000 [IU]/h | INTRAVENOUS | Status: DC
  Filled 2020-02-09: qty 1000
  Filled 2020-02-09: qty 500

## 2020-02-09 MED ORDER — FENTANYL 2.5 MCG/ML W/ROPIVACAINE 0.15% IN NS 100 ML EPIDURAL (ARMC)
EPIDURAL | Status: AC
  Filled 2020-02-09: qty 100

## 2020-02-09 MED ORDER — BUTORPHANOL TARTRATE 1 MG/ML IJ SOLN: 1.0000 mg | INTRAMUSCULAR | Status: DC | PRN

## 2020-02-09 MED ORDER — OXYTOCIN BOLUS FROM INFUSION
333.0000 mL | Freq: Once | INTRAVENOUS | Status: AC
  Administered 2020-02-09: 333 mL via INTRAVENOUS

## 2020-02-09 MED ORDER — LACTATED RINGERS IV SOLN
500.0000 mL | Freq: Once | INTRAVENOUS | Status: AC
  Administered 2020-02-09: 500 mL via INTRAVENOUS

## 2020-02-09 MED ORDER — SOD CITRATE-CITRIC ACID 500-334 MG/5ML PO SOLN: 30.0000 mL | ORAL | Status: DC | PRN

## 2020-02-09 MED ORDER — TETANUS-DIPHTH-ACELL PERTUSSIS 5-2.5-18.5 LF-MCG/0.5 IM SUSY
0.5000 mL | PREFILLED_SYRINGE | Freq: Once | INTRAMUSCULAR | Status: DC
  Filled 2020-02-09: qty 0.5

## 2020-02-09 MED ORDER — MISOPROSTOL 200 MCG PO TABS
ORAL_TABLET | ORAL | Status: AC
  Filled 2020-02-09: qty 4

## 2020-02-09 MED ORDER — AMMONIA AROMATIC IN INHA
RESPIRATORY_TRACT | Status: AC
  Filled 2020-02-09: qty 10

## 2020-02-09 MED ORDER — EPHEDRINE 5 MG/ML INJ: 10.0000 mg | INTRAVENOUS | Status: DC | PRN

## 2020-02-09 MED ORDER — FENTANYL 2.5 MCG/ML W/ROPIVACAINE 0.15% IN NS 100 ML EPIDURAL (ARMC): 12.0000 mL/h | EPIDURAL | Status: DC

## 2020-02-09 MED ORDER — FENTANYL 2.5 MCG/ML W/ROPIVACAINE 0.15% IN NS 100 ML EPIDURAL (ARMC)
EPIDURAL | Status: DC | PRN
  Administered 2020-02-09: 12 mL/h via EPIDURAL

## 2020-02-09 MED ORDER — LACTATED RINGERS IV SOLN
500.0000 mL | INTRAVENOUS | Status: DC | PRN
  Administered 2020-02-09: 1000 mL via INTRAVENOUS

## 2020-02-09 MED ORDER — LIDOCAINE-EPINEPHRINE (PF) 1.5 %-1:200000 IJ SOLN
INTRAMUSCULAR | Status: DC | PRN
  Administered 2020-02-09: 3 mL via PERINEURAL

## 2020-02-09 MED ORDER — ZOLPIDEM TARTRATE 5 MG PO TABS: 5.0000 mg | ORAL_TABLET | Freq: Every evening | ORAL | Status: DC | PRN

## 2020-02-09 MED ORDER — ONDANSETRON HCL 4 MG/2ML IJ SOLN: 4.0000 mg | Freq: Four times a day (QID) | INTRAMUSCULAR | Status: DC | PRN

## 2020-02-09 MED ORDER — PRENATAL MULTIVITAMIN CH
1.0000 | ORAL_TABLET | Freq: Every day | ORAL | Status: DC
  Administered 2020-02-10 – 2020-02-11 (×2): 1 via ORAL
  Filled 2020-02-09 (×2): qty 1

## 2020-02-09 MED ORDER — LIDOCAINE HCL (PF) 1 % IJ SOLN: 30.0000 mL | INTRAMUSCULAR | Status: DC | PRN

## 2020-02-09 MED ORDER — DOCUSATE SODIUM 100 MG PO CAPS
100.0000 mg | ORAL_CAPSULE | Freq: Two times a day (BID) | ORAL | Status: DC
  Administered 2020-02-09 – 2020-02-11 (×4): 100 mg via ORAL
  Filled 2020-02-09 (×4): qty 1

## 2020-02-09 MED ORDER — BUPIVACAINE HCL (PF) 0.25 % IJ SOLN
INTRAMUSCULAR | Status: DC | PRN
  Administered 2020-02-09: 4 mL via EPIDURAL

## 2020-02-09 MED ORDER — ACETAMINOPHEN 325 MG PO TABS: 650.0000 mg | ORAL_TABLET | ORAL | Status: DC | PRN

## 2020-02-09 MED ORDER — OXYTOCIN-SODIUM CHLORIDE 30-0.9 UT/500ML-% IV SOLN: 2.5000 [IU]/h | INTRAVENOUS | Status: DC | PRN

## 2020-02-09 MED ORDER — LACTATED RINGERS IV SOLN: INTRAVENOUS | Status: DC

## 2020-02-09 MED ORDER — IBUPROFEN 600 MG PO TABS
600.0000 mg | ORAL_TABLET | Freq: Four times a day (QID) | ORAL | Status: DC
  Administered 2020-02-09 – 2020-02-11 (×7): 600 mg via ORAL
  Filled 2020-02-09 (×7): qty 1

## 2020-02-09 MED ORDER — OXYTOCIN 10 UNIT/ML IJ SOLN
INTRAMUSCULAR | Status: AC
  Filled 2020-02-09: qty 2

## 2020-02-09 MED ORDER — DIPHENHYDRAMINE HCL 50 MG/ML IJ SOLN: 12.5000 mg | INTRAMUSCULAR | Status: DC | PRN

## 2020-02-09 MED ORDER — OXYCODONE-ACETAMINOPHEN 5-325 MG PO TABS
1.0000 | ORAL_TABLET | ORAL | Status: DC | PRN
  Administered 2020-02-11: 1 via ORAL
  Filled 2020-02-09: qty 1

## 2020-02-09 NOTE — Progress Notes (Signed)
ROB: Patient seen in triage yesterday due to complaints of nausea/vomiting, dehydration.  Given IVF. Of note, was noted to have 2 fetal decelerations which improved after IVF and extended monitoring.  Today patient complains of significant back pain/contractions, occurring every 2 minutes, Cervical exam noted, 5/80-90/-3 (changed from 2/50%/OOP yesterday). Will send to L&D for labor eval and management.

## 2020-02-09 NOTE — H&P (Signed)
History and Physical   HPI  Diana Kirby is a 29 y.o. G3P2002 at [redacted]w[redacted]d Estimated Date of Delivery: 02/10/20 who is being admitted for labor management.  She was seen in the office today was noted to be contracting every 2 to 3 minutes.  She had undergone cervical change per Dr. Valentino Saxon.   OB History  OB History  Gravida Para Term Preterm AB Living  3 2 2  0 0 2  SAB TAB Ectopic Multiple Live Births  0 0 0 0 2    # Outcome Date GA Lbr Len/2nd Weight Sex Delivery Anes PTL Lv  3 Current           2 Term 07/13/11 [redacted]w[redacted]d  3345 g F Vag-Spont  N LIV  1 Term 12/13/09 [redacted]w[redacted]d  3799 g M Vag-Spont EPI N LIV    PROBLEM LIST  Pregnancy complications or risks: Patient Active Problem List   Diagnosis Date Noted  . History of precipitous labor and delivery 01/13/2020  . Pregnancy 12/25/2019  . Anxiety and depression 10/21/2019  . History of postpartum depression, currently pregnant in second trimester 10/21/2019    Prenatal labs and studies: ABO, Rh: B/Positive/-- (04/05 1441) Antibody: Negative (04/05 1441) Rubella: 1.65 (04/05 1441) RPR: Non Reactive (08/11 1050)  HBsAg: Negative (04/05 1441)  HIV: Non Reactive (04/05 1441)  GBS:--/Negative (10/06 1553)   Past Medical History:  Diagnosis Date  . Anxiety   . No pertinent past medical history      Past Surgical History:  Procedure Laterality Date  . NO PAST SURGERIES    . no surgical history       Medications    Current Discharge Medication List    CONTINUE these medications which have NOT CHANGED   Details  hydrOXYzine (ATARAX/VISTARIL) 25 MG tablet Take 1 tablet (25 mg total) by mouth at bedtime as needed for anxiety. Qty: 30 tablet, Refills: 2    Prenatal Vit-Fe Fumarate-FA (PRENATAL MULTIVITAMIN) TABS tablet Take 1 tablet by mouth daily at 12 noon.         Allergies  Penicillins  Review of Systems  Pertinent items noted in HPI and remainder of comprehensive ROS otherwise negative.  Physical  Exam  LMP 05/06/2019 (Exact Date)   Lungs:  CTA B Cardio: RRR without M/R/G Abd: Soft, gravid, NT Presentation: cephalic EXT: No C/C/ 1+ Edema DTRs: 2+ B CERVIX:   5cm See Prenatal records for more detailed PE.      Test Results  Results for orders placed or performed in visit on 02/09/20 (from the past 24 hour(s))  POC Urinalysis Dipstick OB     Status: Abnormal   Collection Time: 02/09/20  2:02 PM  Result Value Ref Range   Color, UA auburn    Clarity, UA clear    Glucose, UA Negative Negative   Bilirubin, UA neg    Ketones, UA large    Spec Grav, UA 1.020 1.010 - 1.025   Blood, UA 1+    pH, UA 6.5 5.0 - 8.0   POC,PROTEIN,UA Moderate (2+) Negative, Trace, Small (1+), Moderate (2+), Large (3+), 4+   Urobilinogen, UA 0.2 0.2 or 1.0 E.U./dL   Nitrite, UA neg    Leukocytes, UA Moderate (2+) (A) Negative   Appearance auburn;clear    Odor     Group B Strep negative  Assessment   G3P2002 at [redacted]w[redacted]d Estimated Date of Delivery: 02/10/20  The fetus is reassuring.   Patient Active Problem List   Diagnosis  Date Noted  . History of precipitous labor and delivery 01/13/2020  . Pregnancy 12/25/2019  . Anxiety and depression 10/21/2019  . History of postpartum depression, currently pregnant in second trimester 10/21/2019    Plan  1. Admit to L&D :   2. EFM: -- Category 1 3. Stadol or Epidural if desired.   4. Admission labs  5.  Expect vaginal birth  Elonda Husky, M.D. 02/09/2020 2:36 PM

## 2020-02-09 NOTE — Anesthesia Preprocedure Evaluation (Signed)
Anesthesia Evaluation  Patient identified by MRN, date of birth, ID band Patient awake    Reviewed: Allergy & Precautions, H&P , NPO status , Patient's Chart, lab work & pertinent test results  Airway Mallampati: III  TM Distance: >3 FB Neck ROM: full    Dental no notable dental hx. (+) Teeth Intact   Pulmonary    Pulmonary exam normal        Cardiovascular Normal cardiovascular exam     Neuro/Psych PSYCHIATRIC DISORDERS Anxiety Depression negative neurological ROS     GI/Hepatic negative GI ROS, Neg liver ROS,   Endo/Other  negative endocrine ROS  Renal/GU negative Renal ROS  negative genitourinary   Musculoskeletal   Abdominal   Peds  Hematology negative hematology ROS (+)   Anesthesia Other Findings   Reproductive/Obstetrics (+) Pregnancy                             Anesthesia Physical Anesthesia Plan  ASA: II  Anesthesia Plan: Epidural   Post-op Pain Management:    Induction:   PONV Risk Score and Plan:   Airway Management Planned:   Additional Equipment:   Intra-op Plan:   Post-operative Plan:   Informed Consent: I have reviewed the patients History and Physical, chart, labs and discussed the procedure including the risks, benefits and alternatives for the proposed anesthesia with the patient or authorized representative who has indicated his/her understanding and acceptance.     Dental Advisory Given  Plan Discussed with: Anesthesiologist and CRNA  Anesthesia Plan Comments:         Anesthesia Quick Evaluation

## 2020-02-09 NOTE — Discharge Summary (Signed)
    L&D OB Triage Note  SUBJECTIVE Diana Kirby is a 29 y.o. G3P2002 female at [redacted]w[redacted]d, EDD Estimated Date of Delivery: 02/10/20 who presented to triage with complaints of diarrhea for about 2 days and concerned about possibly being dehydrated. Denies vomiting.  States she started having contractions last night which continued into today.  Denies leaking of fluid or vaginal bleeding.   OB History  Gravida Para Term Preterm AB Living  3 2 2  0 0 2  SAB TAB Ectopic Multiple Live Births  0 0 0 0 2    # Outcome Date GA Lbr Len/2nd Weight Sex Delivery Anes PTL Lv  3 Current           2 Term 07/13/11 [redacted]w[redacted]d  3345 g F Vag-Spont  N LIV  1 Term 12/13/09 [redacted]w[redacted]d  3799 g M Vag-Spont EPI N LIV    Medications Prior to Admission  Medication Sig Dispense Refill Last Dose  . hydrOXYzine (ATARAX/VISTARIL) 25 MG tablet Take 1 tablet (25 mg total) by mouth at bedtime as needed for anxiety. 30 tablet 2   . Prenatal Vit-Fe Fumarate-FA (PRENATAL MULTIVITAMIN) TABS tablet Take 1 tablet by mouth daily at 12 noon.        OBJECTIVE  Nursing Evaluation:   BP 118/63   Pulse (!) 57   Temp 99 F (37.2 C) (Oral)   Resp 20   Ht 5\' 2"  (1.575 m)   Wt 97.3 kg   LMP 05/06/2019 (Exact Date)   SpO2 99%   BMI 39.23 kg/m    Findings:        Reactive NST      Two random decelerations noted with excellent variability between them.      Irregular contractions without cervical change      IV fluids given for elevated urinalysis specific gravity suspect moderate dehydration      NST was performed and has been reviewed by me.  NST INTERPRETATION: Category I  Mode: External Baseline Rate (A): 130 bpm Variability: Moderate Accelerations: 15 x 15 Decelerations: None     Contraction Frequency (min): 2-5 w/UI  ASSESSMENT Impression:  1.  Pregnancy:  at [redacted]w[redacted]d , EDD Estimated Date of Delivery: 02/10/20 2.  Reassuring fetal and maternal status 3.  Mild diarrhea-moderate dehydration 4.  Irregular  contractions possible early latent labor  PLAN 1. Discussed current condition and above findings with patient and reassurance given.  All questions answered. 2. Discharge home with standard labor precautions given to return to L&D or call the office for problems. 3.  Patient advised to orally hydrate 4.  Ambien for sleep tonight 5.  To present to [facility] tomorrow for evaluation and NST

## 2020-02-09 NOTE — Anesthesia Procedure Notes (Addendum)
Epidural Patient location during procedure: OB Start time: 02/09/2020 4:00 PM End time: 02/09/2020 4:05 PM  Staffing Anesthesiologist: Lenard Simmer, MD Resident/CRNA: Elmarie Mainland, CRNA Performed: resident/CRNA   Preanesthetic Checklist Completed: patient identified, IV checked, site marked, risks and benefits discussed, surgical consent, monitors and equipment checked, pre-op evaluation and timeout performed  Epidural Patient position: sitting Prep: ChloraPrep Patient monitoring: heart rate, continuous pulse ox and blood pressure Approach: midline Location: L3-L4 Injection technique: LOR saline  Needle:  Needle type: Tuohy  Needle gauge: 17 G Needle length: 9 cm and 9 Needle insertion depth: 7 cm Catheter type: closed end flexible Catheter size: 19 Gauge Catheter at skin depth: 12 cm Test dose: negative and 1.5% lidocaine with Epi 1:200 K  Assessment Events: blood not aspirated, injection not painful, no injection resistance, no paresthesia and negative IV test  Additional Notes 1 attempt Pt. Evaluated and documentation done after procedure finished. Patient identified. Risks/Benefits/Options discussed with patient including but not limited to bleeding, infection, nerve damage, paralysis, failed block, incomplete pain control, headache, blood pressure changes, nausea, vomiting, reactions to medication both or allergic, itching and postpartum back pain. Confirmed with bedside nurse the patient's most recent platelet count. Confirmed with patient that they are not currently taking any anticoagulation, have any bleeding history or any family history of bleeding disorders. Patient expressed understanding and wished to proceed. All questions were answered. Sterile technique was used throughout the entire procedure. Please see nursing notes for vital signs. Test dose was given through epidural catheter and negative prior to continuing to dose epidural or start infusion. Warning  signs of high block given to the patient including shortness of breath, tingling/numbness in hands, complete motor block, or any concerning symptoms with instructions to call for help. Patient was given instructions on fall risk and not to get out of bed. All questions and concerns addressed with instructions to call with any issues or inadequate analgesia.   Patient tolerated the insertion well without immediate complications.Reason for block:procedure for pain

## 2020-02-10 ENCOUNTER — Inpatient Hospital Stay: Admit: 2020-02-10 | Payer: Self-pay

## 2020-02-10 LAB — RPR: RPR Ser Ql: NONREACTIVE

## 2020-02-10 NOTE — Anesthesia Postprocedure Evaluation (Signed)
Anesthesia Post Note  Patient: Diana Kirby  Procedure(s) Performed: AN AD HOC LABOR EPIDURAL  Patient location during evaluation: Mother Baby Anesthesia Type: Epidural Level of consciousness: awake, awake and alert, oriented and patient cooperative Pain management: pain level controlled Vital Signs Assessment: post-procedure vital signs reviewed and stable Respiratory status: spontaneous breathing, nonlabored ventilation and respiratory function stable Cardiovascular status: stable Postop Assessment: no headache, no backache, patient able to bend at knees, able to ambulate, adequate PO intake and no apparent nausea or vomiting Anesthetic complications: no   No complications documented.   Last Vitals:  Vitals:   02/10/20 0315 02/10/20 0743  BP: 129/80 (!) 111/98  Pulse: 63 (!) 59  Resp: 18 18  Temp: 36.7 C 36.6 C  SpO2: 100% 99%    Last Pain:  Vitals:   02/10/20 0743  TempSrc: Oral  PainSc:                  Lyn Records

## 2020-02-10 NOTE — Progress Notes (Signed)
Progress Note - Vaginal Delivery  Diana Kirby is a 29 y.o. G3P3003 now PP day 1 s/p Vaginal, Spontaneous .   Subjective:  The patient reports no complaints, up ad lib, voiding and tolerating PO She is bottlefeeding  Objective:  Vital signs in last 24 hours: Temp:  [97.9 F (36.6 C)-100.1 F (37.8 C)] 97.9 F (36.6 C) (11/03 0743) Pulse Rate:  [53-148] 59 (11/03 0743) Resp:  [16-18] 18 (11/03 0743) BP: (103-150)/(52-105) 111/98 (11/03 0743) SpO2:  [93 %-100 %] 99 % (11/03 0743) Weight:  [100.1 kg] 100.1 kg (11/02 1518)  Physical Exam:  General: alert, cooperative and no distress Lochia: appropriate Uterine Fundus: firm    Data Review Recent Labs    02/09/20 1516  HGB 10.6*  HCT 32.7*    Assessment/Plan: Active Problems:   * No active hospital problems. *   Plan for discharge tomorrow   -- Continue routine PP care.     Elonda Husky, M.D. 02/10/2020 8:28 AM

## 2020-02-11 NOTE — Discharge Summary (Signed)
Patient Name: Diana Kirby DOB: February 06, 1991 MRN: [id number]                            Discharge Summary  Date of Admission: 02/09/2020 Date of Discharge: 02/11/2020 Delivering Provider: Linzie Collin   Admitting Diagnosis: Normal labor [O80, Z37.9] at [redacted]w[redacted]d Secondary diagnosis:  Active Problems:   * No active hospital problems. *   Mode of Delivery: normal spontaneous vaginal delivery              Discharge diagnosis: Term Pregnancy Delivered      Intrapartum Procedures: Atificial rupture of membranes and epidural   Post partum procedures:   Complications: none                     Discharge Day SOAP Note:  Progress Note - Vaginal Delivery  Diana Kirby is a 29 y.o. G3P3003 now PP day 2 s/p Vaginal, Spontaneous . Delivery was uncomplicated  Subjective  The patient has the following complaints: has no unusual complaints  Pain is controlled with current medications.   Patient is urinating without difficulty.  She is ambulating well.     Objective  Vital signs: BP 130/81 (BP Location: Left Arm)   Pulse (!) 53   Temp 97.7 F (36.5 C) (Oral)   Resp 20   Ht 5\' 2"  (1.575 m)   Wt 100.1 kg   LMP 05/06/2019 (Exact Date)   SpO2 99%   Breastfeeding Unknown   BMI 40.36 kg/m   Physical Exam: Gen: NAD Fundus Fundal Tone: Firm  Lochia Amount: Small        Data Review Labs: Lab Results  Component Value Date   WBC 8.3 02/09/2020   HGB 10.6 (L) 02/09/2020   HCT 32.7 (L) 02/09/2020   MCV 87.7 02/09/2020   PLT 169 02/09/2020   CBC Latest Ref Rng & Units 02/09/2020 02/02/2020 11/18/2019  WBC 4.0 - 10.5 K/uL 8.3 7.1 7.6  Hemoglobin 12.0 - 15.0 g/dL 10.6(L) 11.1 11.1  Hematocrit 36 - 46 % 32.7(L) 32.5(L) 34.6  Platelets 150 - 400 K/uL 169 159 212   B POS Performed at [facility], [address] [place], [place]   [id number] Score: Inocente Salles Postnatal Depression Scale Screening Tool 02/10/2020  I have been able to laugh and see  the funny side of things. 0  I have looked forward with enjoyment to things. 1  I have blamed myself unnecessarily when things went wrong. 0  I have been anxious or worried for no good reason. 0  I have felt scared or panicky for no good reason. 0  Things have been getting on top of me. 3  I have been so unhappy that I have had difficulty sleeping. 1  I have felt sad or miserable. 1  I have been so unhappy that I have been crying. 1  The thought of harming myself has occurred to me. 0  Edinburgh Postnatal Depression Scale Total 7    Assessment/Plan  Active Problems:   * No active hospital problems. *    Plan for discharge today.  Discharge Instructions: Per After Visit Summary. Activity: Advance as tolerated. Pelvic rest for 6 weeks.  Also refer to After Visit Summary Diet: Regular Medications: Allergies as of 02/11/2020      Reactions   Penicillins Hives      Medication List    STOP taking these medications  hydrOXYzine 25 MG tablet Commonly known as: ATARAX/VISTARIL     TAKE these medications   prenatal multivitamin Tabs tablet Take 1 tablet by mouth daily at 12 noon.      Outpatient follow up:   Follow-up Information    Linzie Collin, MD Follow up in 4 week(s).   Specialties: Obstetrics and Gynecology, Radiology Contact information: [address] [place] [address] [phone]              Postpartum contraception: Will discuss at first office visit post-partum  Discharged Condition: good  Discharged to: home  Newborn Data: Disposition:home with mother  Apgars: APGAR (1 MIN): 8   APGAR (5 MINS): 9   APGAR (10 MINS):    Baby Feeding: Bottle    Elonda Husky, M.D. 02/11/2020 10:49 AM

## 2020-02-11 NOTE — Progress Notes (Signed)
Patient discharged home with infant. Discharge instructions, prescriptions and follow up appointment given to and reviewed with patient. Patient verbalized understanding. Pt wheeled out with infant by auxiliary.  

## 2020-03-14 ENCOUNTER — Encounter: Payer: Medicaid Other | Admitting: Obstetrics and Gynecology

## 2020-03-16 ENCOUNTER — Encounter: Payer: Medicaid Other | Admitting: Obstetrics and Gynecology

## 2020-03-22 ENCOUNTER — Encounter: Payer: Self-pay | Admitting: Obstetrics and Gynecology

## 2020-03-22 ENCOUNTER — Other Ambulatory Visit: Payer: Self-pay

## 2020-03-22 ENCOUNTER — Ambulatory Visit (INDEPENDENT_AMBULATORY_CARE_PROVIDER_SITE_OTHER): Payer: Medicaid Other | Admitting: Obstetrics and Gynecology

## 2020-03-22 NOTE — Progress Notes (Signed)
HPI:      [name] is a 29 y.o. [phone] who LMP was No LMP recorded.  Subjective:   She presents today approximately 6 weeks postpartum.  She reports she is doing well.  She is bottlefeeding.  She reports that she had postpartum depression with her last baby but is doing better with this baby.  She still has some difficulty with insomnia but overall she thinks she is doing well. Patient says she is not sexually active and has no immediate plans to be sexually active.  She does not want any form of birth control at this time.    Hx: The following portions of the patient's history were reviewed and updated as appropriate:             She  has a past medical history of Anxiety and No pertinent past medical history. She does not have any pertinent problems on file. She  has a past surgical history that includes no surgical history and No past surgeries. Her family history includes Dementia in her maternal grandfather; Diabetes in her paternal aunt; Fibroids in her paternal aunt; Healthy in her father, maternal grandmother, mother, and paternal grandmother; Lupus in her paternal aunt. She  reports that she has never smoked. She has never used smokeless tobacco. She reports previous alcohol use. She reports that she does not use drugs. She currently has no medications in their medication list. She is allergic to penicillins.       Review of Systems:  Review of Systems  Constitutional: Denied constitutional symptoms, night sweats, recent illness, fatigue, fever, insomnia and weight loss.  Eyes: Denied eye symptoms, eye pain, photophobia, vision change and visual disturbance.  Ears/Nose/Throat/Neck: Denied ear, nose, throat or neck symptoms, hearing loss, nasal discharge, sinus congestion and sore throat.  Cardiovascular: Denied cardiovascular symptoms, arrhythmia, chest pain/pressure, edema, exercise intolerance, orthopnea and palpitations.  Respiratory: Denied pulmonary symptoms, asthma,  pleuritic pain, productive sputum, cough, dyspnea and wheezing.  Gastrointestinal: Denied, gastro-esophageal reflux, melena, nausea and vomiting.  Genitourinary: Denied genitourinary symptoms including symptomatic vaginal discharge, pelvic relaxation issues, and urinary complaints.  Musculoskeletal: Denied musculoskeletal symptoms, stiffness, swelling, muscle weakness and myalgia.  Dermatologic: Denied dermatology symptoms, rash and scar.  Neurologic: Denied neurology symptoms, dizziness, headache, neck pain and syncope.  Psychiatric: Denied psychiatric symptoms, anxiety and depression.  Endocrine: Denied endocrine symptoms including hot flashes and night sweats.   Meds:   No current outpatient medications on file prior to visit.   No current facility-administered medications on file prior to visit.       Upstream - 03/22/20 1537      Pregnancy Intention Screening   Does the patient want to become pregnant in the next year? No    Does the patient's partner want to become pregnant in the next year? No    Would the patient like to discuss contraceptive options today? No      Contraception Wrap Up   Current Method No Contraceptive Precautions    End Method No Contraception Precautions    Contraception Counseling Provided No          The pregnancy intention screening data noted above was reviewed. Potential methods of contraception were discussed. The patient elected to proceed with Abstinence.     Objective:     Vitals:   03/22/20 1535  BP: 121/71  Pulse: 69   Filed Weights   03/22/20 1535  Weight: 187 lb 11.2 oz (85.1 kg)  Physical examination   Pelvic:   Vulva: Normal appearance.  No lesions.  Vagina: No lesions or abnormalities noted.  Support: Normal pelvic support.  Urethra No masses tenderness or scarring.  Meatus Normal size without lesions or prolapse.  Cervix: Normal appearance.  No lesions.  Anus: Normal exam.  No lesions.  Perineum: Normal  exam.  No lesions.        Bimanual   Uterus: Normal size.  Non-tender.  Mobile.  AV.  Adnexae: No masses.  Non-tender to palpation.  Cul-de-sac: Negative for abnormality.     Assessment:    [id number] Patient Active Problem List   Diagnosis Date Noted  . History of precipitous labor and delivery 01/13/2020  . Pregnancy 12/25/2019  . Anxiety and depression 10/21/2019  . History of postpartum depression, currently pregnant in second trimester 10/21/2019     1. Postpartum care and examination immediately after delivery     Patient doing well.  Postpartum depression discussed in detail.   Plan:            1.  Patient may resume normal activities with exception of heavy lifting.  2.  Patient to inform us if she is getting worse with insomnia or signs of depression. Orders No orders of the defined types were placed in this encounter.   No orders of the defined types were placed in this encounter.     F/U  No follow-ups on file.  Elonda Husky, M.D. 03/22/2020 3:41 PM

## 2020-04-02 IMAGING — CT CT HEAD WITHOUT CONTRAST
3 series · 16 of 46 positions shown, 19 images · non-contrast
Comparison: None.

CLINICAL DATA: 1 week history of migraine headache.

EXAM:
CT HEAD WITHOUT CONTRAST
TECHNIQUE: Contiguous axial images were obtained from the base of the skull
through the vertex without intravenous contrast.

[Series 3: head wo · axial · 0.45mm/px · z∈[-193,-73]mm · 10 of 29 slices shown, 13 images]
[im 3/29  brain]
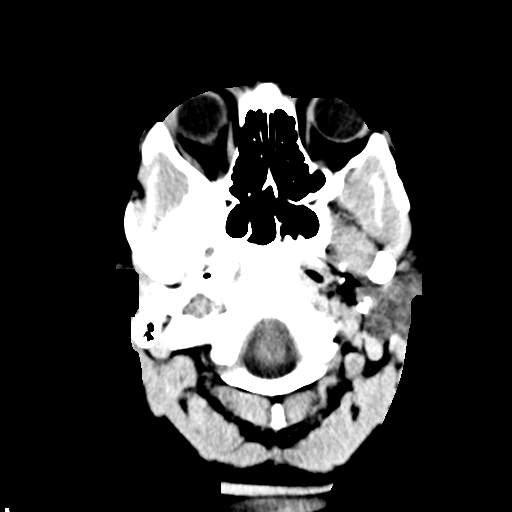
[im 3/29  bone]
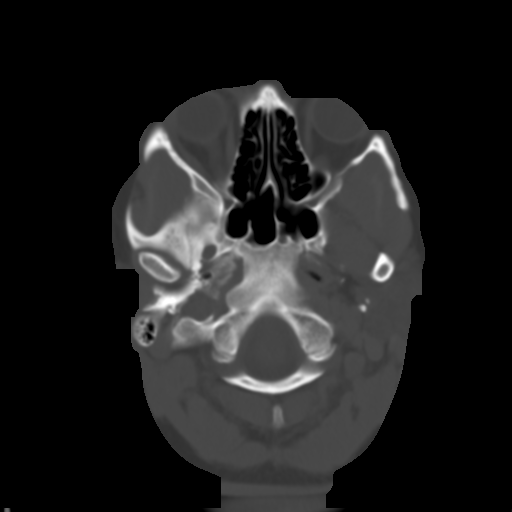
[im 6/29  brain]
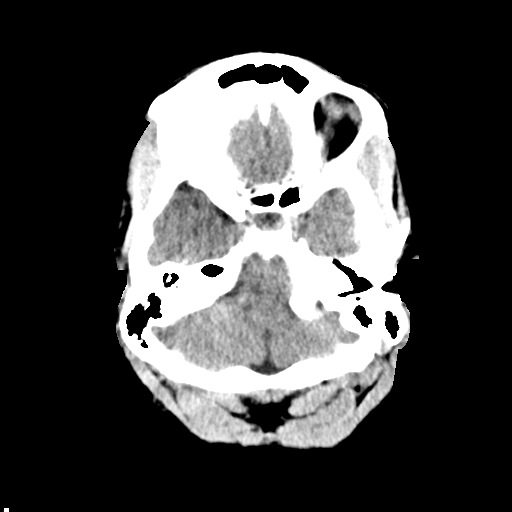
[im 8/29  brain]
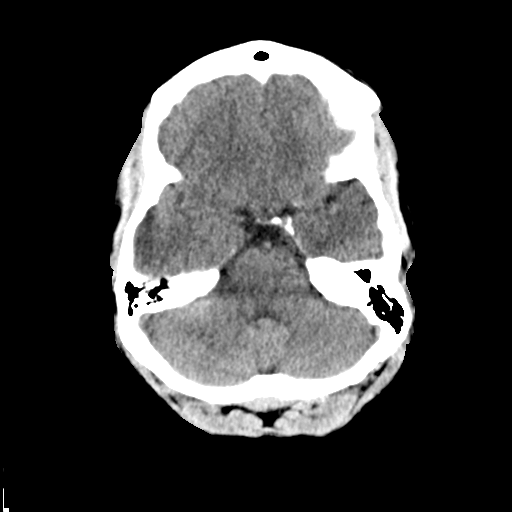
[im 11/29  brain]
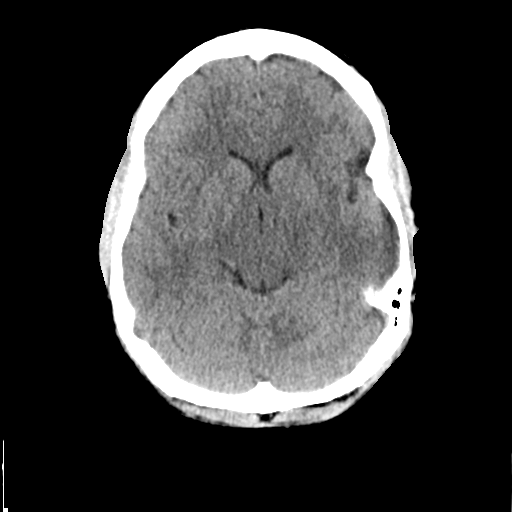
[im 14/29  brain]
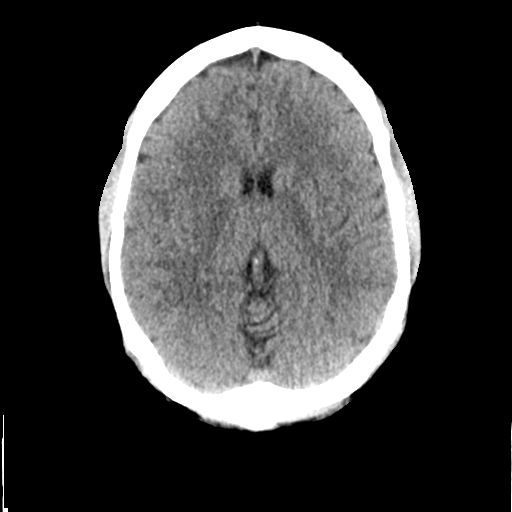
[im 14/29  bone]
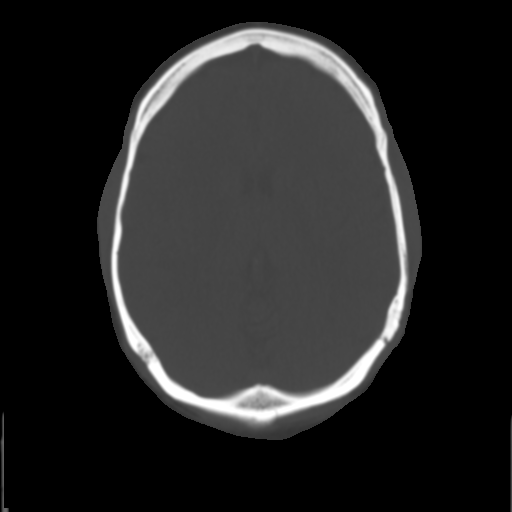
[im 16/29  brain]
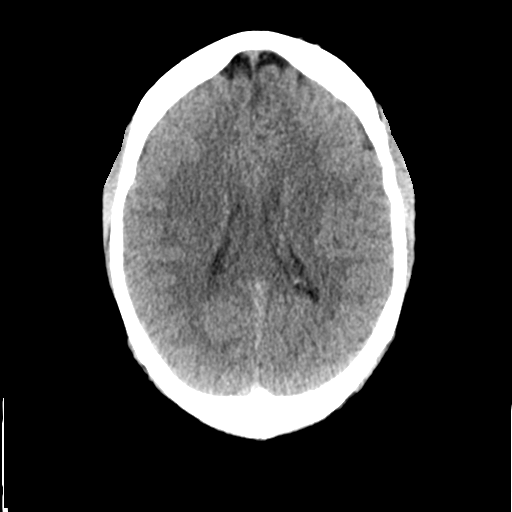
[im 19/29  brain]
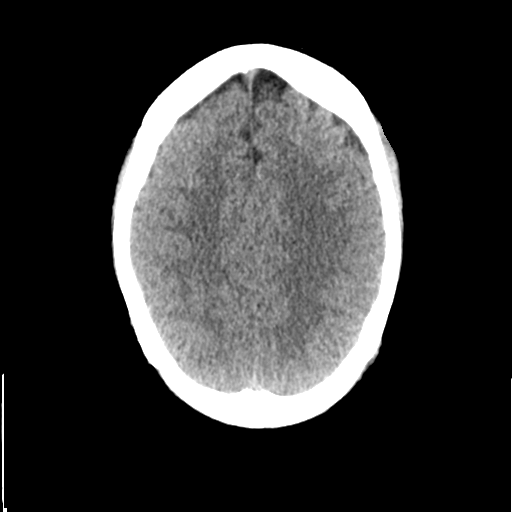
[im 22/29  brain]
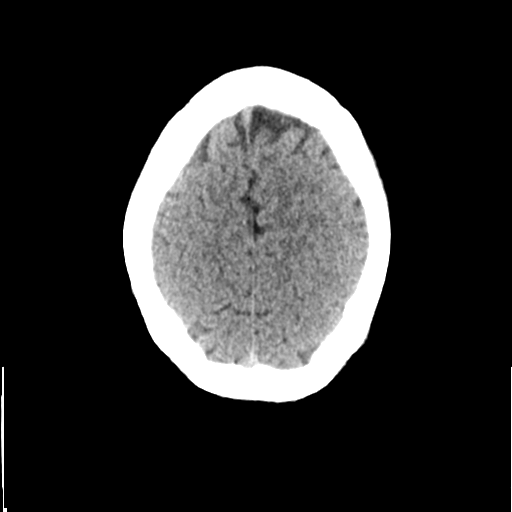
[im 24/29  brain]
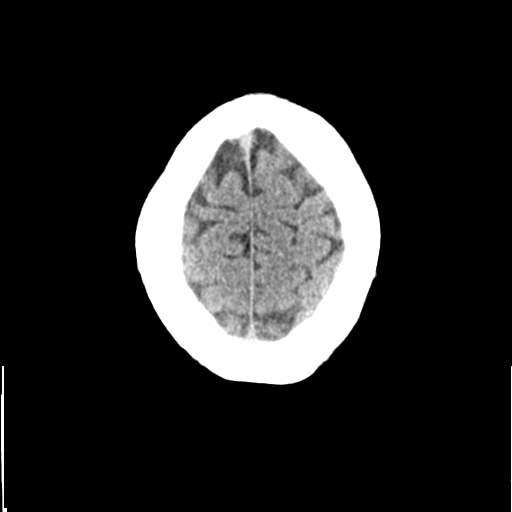
[im 24/29  bone]
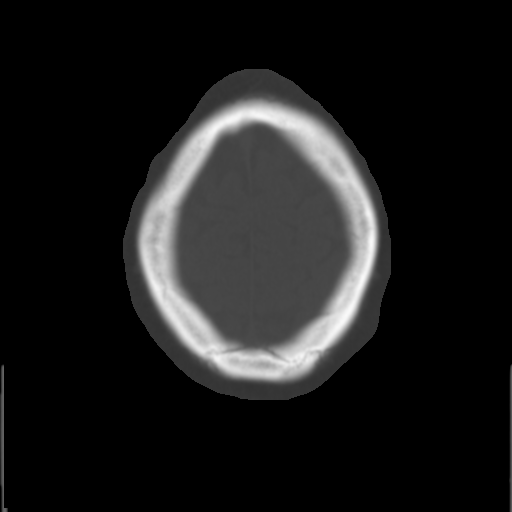
[im 27/29  brain]
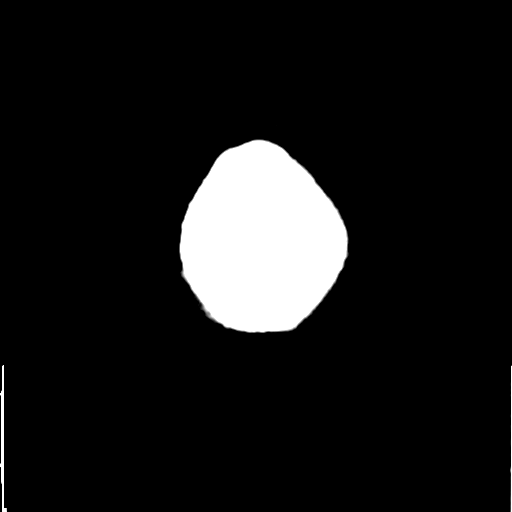

[Series 4: coronal soft tissue · coronal · 0.32mm/px · 3 of 66 slices shown]
[im 22/66  brain]
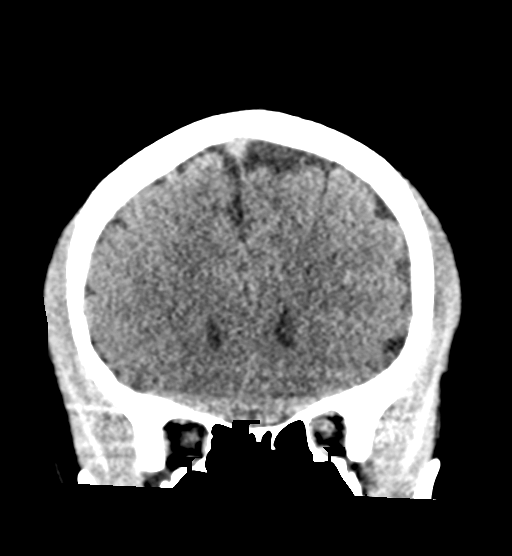
[im 29/66  brain]
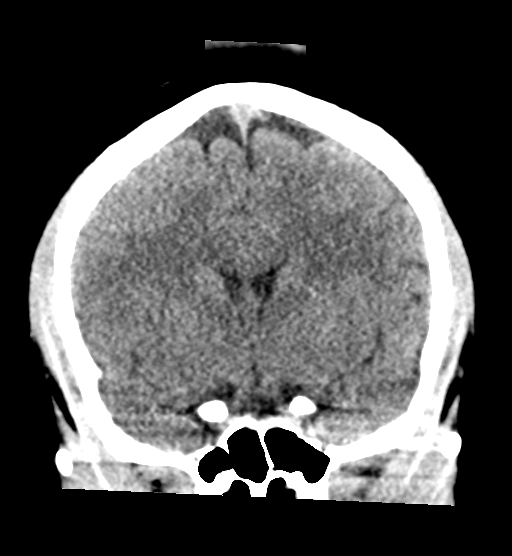
[im 37/66  brain]
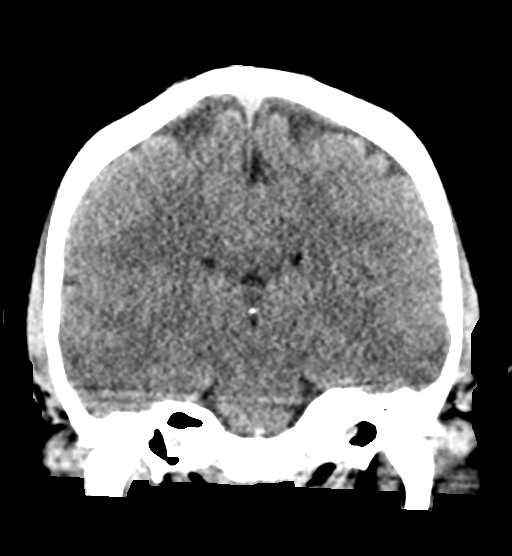

[Series 5: sagittal soft tissue · sagittal · 0.35mm/px · 3 of 55 slices shown]
[im 19/55  brain]
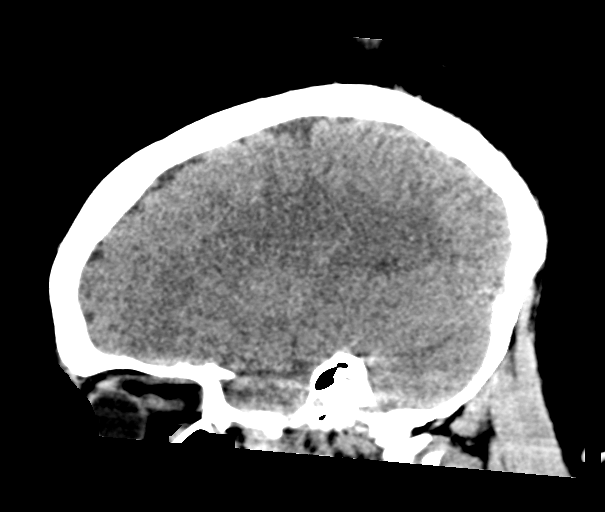
[im 28/55  brain]
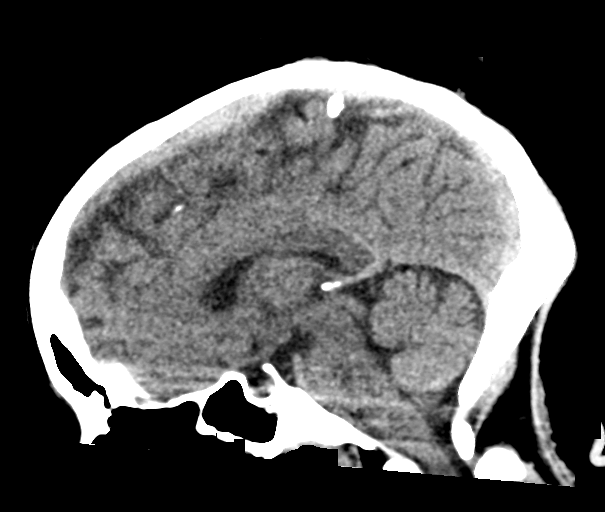
[im 37/55  brain]
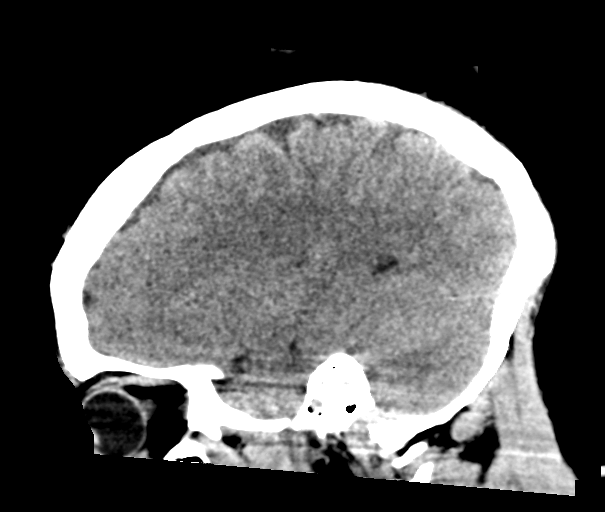

[16 of 46 positions shown; findings below may reference images not displayed]

FINDINGS: Brain: There is no evidence for acute hemorrhage, hydrocephalus,
mass lesion, or abnormal extra-axial fluid collection. No definite
CT evidence for acute infarction.

Vascular: No hyperdense vessel or unexpected calcification.

Skull: No evidence for fracture. No worrisome lytic or sclerotic
lesion.

Sinuses/Orbits: The visualized paranasal sinuses and mastoid air
cells are clear. Visualized portions of the globes and intraorbital
fat are unremarkable.

Other: None.
IMPRESSION: Normal exam.

## 2020-04-04 ENCOUNTER — Other Ambulatory Visit: Payer: Self-pay

## 2020-04-04 ENCOUNTER — Encounter: Payer: Self-pay | Admitting: Emergency Medicine

## 2020-04-04 ENCOUNTER — Emergency Department
Admission: EM | Admit: 2020-04-04 | Discharge: 2020-04-04 | Disposition: A | Discharge status: Home / Self Care | Payer: Medicaid Other | Attending: Emergency Medicine | Admitting: Emergency Medicine

## 2020-04-04 DIAGNOSIS — R519 Headache, unspecified: Secondary | ICD-10-CM | POA: Diagnosis present

## 2020-04-04 DIAGNOSIS — U071 COVID-19: Secondary | ICD-10-CM | POA: Diagnosis not present

## 2020-04-04 LAB — RESP PANEL BY RT-PCR (FLU A&B, COVID) ARPGX2
Influenza A by PCR: NEGATIVE
Influenza B by PCR: NEGATIVE
SARS Coronavirus 2 by RT PCR: POSITIVE — AB

## 2020-04-04 NOTE — ED Provider Notes (Signed)
[facility] Emergency Department Provider Note  ____________________________________________  Time seen: Approximately 2:45 PM  I have reviewed the triage vital signs and the nursing notes.   HISTORY  Chief Complaint Sore Throat and Generalized Body Aches    HPI Diana Kirby is a 29 y.o. female that presents to the emergency department for evaluation of chills, headache, sore throat, body aches for 2 days.  Patient states that her partner has a headache as well.  Patient works at [employer] and her partner works at [employer].  She has not had a Covid vaccine.  No shortness of breath, chest pain, vomiting, diarrhea.  Past Medical History:  Diagnosis Date   Anxiety    No pertinent past medical history     Patient Active Problem List   Diagnosis Date Noted   History of precipitous labor and delivery 01/13/2020   Pregnancy 12/25/2019   Anxiety and depression 10/21/2019   History of postpartum depression, currently pregnant in second trimester 10/21/2019    Past Surgical History:  Procedure Laterality Date   NO PAST SURGERIES     no surgical history      Prior to Admission medications   Not on File    Allergies Penicillins  Family History  Problem Relation Age of Onset   Healthy Mother    Healthy Father    Lupus Paternal Aunt    Diabetes Paternal Aunt    Fibroids Paternal Aunt    Healthy Maternal Grandmother    Dementia Maternal Grandfather    Healthy Paternal Grandmother     Social History Social History   Tobacco Use   Smoking status: Never Smoker   Smokeless tobacco: Never Used  Building services engineer Use: Never used  Substance Use Topics   Alcohol use: Not Currently   Drug use: Never     Review of Systems  Constitutional: No fever/chills Eyes: No visual changes. No discharge. ENT: Negative for congestion and rhinorrhea. Positive for sore throat. Cardiovascular: No chest pain. Respiratory: Positive for cough.  No SOB. Gastrointestinal: No abdominal pain.  No vomiting.  No diarrhea.  No constipation. Musculoskeletal: Positive for body aches. Skin: Negative for rash, abrasions, lacerations, ecchymosis. Neurological: Positive for headaches.   ____________________________________________   PHYSICAL EXAM:  VITAL SIGNS: ED Triage Vitals  Enc Vitals Group     BP 04/04/20 1304 104/70     Pulse Rate 04/04/20 1304 (!) 103     Resp 04/04/20 1304 16     Temp 04/04/20 1304 99.9 F (37.7 C)     Temp Source 04/04/20 1304 Oral     SpO2 04/04/20 1304 98 %     Weight 04/04/20 1302 187 lb 9.8 oz (85.1 kg)     Height 04/04/20 1302 5\' 6"  (1.676 m)     Head Circumference --      Peak Flow --      Pain Score 04/04/20 1301 8     Pain Loc --      Pain Edu? --      Excl. in GC? --      Constitutional: Alert and oriented. Well appearing and in no acute distress. Eyes: Conjunctivae are normal. PERRL. EOMI. No discharge. Head: Atraumatic. ENT: No frontal and maxillary sinus tenderness.      Ears: Tympanic membranes pearly gray with good landmarks. No discharge.      Nose: No congestion/rhinnorhea.      Mouth/Throat: Mucous membranes are moist. Oropharynx non-erythematous. Tonsils not enlarged. No exudates. Uvula midline.  Neck: No stridor.   Hematological/Lymphatic/Immunilogical: No cervical lymphadenopathy. Cardiovascular: Normal rate, regular rhythm.  Good peripheral circulation. Respiratory: Normal respiratory effort without tachypnea or retractions. Lungs CTAB. Good air entry to the bases with no decreased or absent breath sounds. Gastrointestinal: Bowel sounds 4 quadrants. Soft and nontender to palpation. No guarding or rigidity. No palpable masses. No distention. Musculoskeletal: Full range of motion to all extremities. No gross deformities appreciated. Neurologic:  Normal speech and language. No gross focal neurologic deficits are appreciated.  Skin:  Skin is warm, dry and intact. No rash  noted. Psychiatric: Mood and affect are normal. Speech and behavior are normal. Patient exhibits appropriate insight and judgement.   ____________________________________________   LABS (all labs ordered are listed, but only abnormal results are displayed)  Labs Reviewed  RESP PANEL BY RT-PCR (FLU A&B, COVID) ARPGX2 - Abnormal; Notable for the following components:      Result Value   SARS Coronavirus 2 by RT PCR POSITIVE (*)    All other components within normal limits   ____________________________________________  EKG   ____________________________________________  RADIOLOGY   No results found.  ____________________________________________    PROCEDURES  Procedure(s) performed:    Procedures    Medications - No data to display   ____________________________________________   INITIAL IMPRESSION / ASSESSMENT AND PLAN / ED COURSE  Pertinent labs & imaging results that were available during my care of the patient were reviewed by me and considered in my medical decision making (see chart for details).  Review of the [place] CSRS was performed in accordance of the [facility] prior to dispensing any controlled drugs.   Patient's diagnosis is consistent with Covid 19. Vital signs and exam are reassuring. Covid test is positive. Education provided. Patient appears well and is staying well hydrated. Patient should alternate tylenol and ibuprofen for fever. Patient feels comfortable going home. Patient is to follow up with PCP as needed or otherwise directed. Patient is given ED precautions to return to the ED for any worsening or new symptoms.   Diana Kirby was evaluated in Emergency Department on 04/04/2020 for the symptoms described in the history of present illness. She was evaluated in the context of the global COVID-19 pandemic, which necessitated consideration that the patient might be at risk for infection with the SARS-CoV-2 virus that causes COVID-19. Institutional  protocols and algorithms that pertain to the evaluation of patients at risk for COVID-19 are in a state of rapid change based on information released by regulatory bodies including the CDC and federal and state organizations. These policies and algorithms were followed during the patient's care in the ED.  ____________________________________________  FINAL CLINICAL IMPRESSION(S) / ED DIAGNOSES  Final diagnoses:  COVID-19      NEW MEDICATIONS STARTED DURING THIS VISIT:  ED Discharge Orders    None          This chart was dictated using voice recognition software/Dragon. Despite best efforts to proofread, errors can occur which can change the meaning. Any change was purely unintentional.    Enid Derry, PA-C 04/04/20 1502    Gilles Chiquito, MD 04/04/20 2153

## 2020-04-04 NOTE — ED Notes (Addendum)
See triage note- pt here with generalized body aches, sore throat and headache. States she felt so bad yesterday she was unable to get out of bed. Reports loss of taste and smell. States possible covid exposure through husbands coworker.

## 2020-04-04 NOTE — ED Triage Notes (Signed)
Sore throat, body aches, headache x 2 days.  AAOx3.  Skin warm and dry.  NAD

## 2020-04-05 ENCOUNTER — Encounter: Payer: Self-pay | Admitting: Physician Assistant

## 2020-08-04 ENCOUNTER — Other Ambulatory Visit: Payer: Self-pay

## 2020-08-04 ENCOUNTER — Other Ambulatory Visit (HOSPITAL_COMMUNITY)
Admission: RE | Admit: 2020-08-04 | Discharge: 2020-08-04 | Disposition: A | Discharge status: Home / Self Care | Payer: Medicaid Other | Source: Ambulatory Visit | Attending: Obstetrics | Admitting: Obstetrics

## 2020-08-04 ENCOUNTER — Ambulatory Visit (INDEPENDENT_AMBULATORY_CARE_PROVIDER_SITE_OTHER): Payer: Medicaid Other | Admitting: Obstetrics

## 2020-08-04 ENCOUNTER — Encounter: Payer: Self-pay | Admitting: Obstetrics

## 2020-08-04 VITALS — Wt 195.0 lb

## 2020-08-04 DIAGNOSIS — Z3201 Encounter for pregnancy test, result positive: Secondary | ICD-10-CM

## 2020-08-04 DIAGNOSIS — Z3480 Encounter for supervision of other normal pregnancy, unspecified trimester: Secondary | ICD-10-CM

## 2020-08-04 DIAGNOSIS — Z3A01 Less than 8 weeks gestation of pregnancy: Secondary | ICD-10-CM | POA: Diagnosis not present

## 2020-08-04 DIAGNOSIS — N912 Amenorrhea, unspecified: Secondary | ICD-10-CM

## 2020-08-04 DIAGNOSIS — Z3481 Encounter for supervision of other normal pregnancy, first trimester: Secondary | ICD-10-CM | POA: Diagnosis not present

## 2020-08-04 LAB — POCT URINALYSIS DIPSTICK OB
Appearance: NORMAL
Bilirubin, UA: NEGATIVE
Blood, UA: NEGATIVE
Glucose, UA: NEGATIVE
Ketones, UA: NEGATIVE
Leukocytes, UA: NEGATIVE
Nitrite, UA: NEGATIVE
Odor: NORMAL
Spec Grav, UA: 1.01
Urobilinogen, UA: 0.2 U/dL
pH, UA: 7.5

## 2020-08-04 LAB — POCT URINE PREGNANCY: Preg Test, Ur: POSITIVE — AB

## 2020-08-04 NOTE — Progress Notes (Signed)
New Obstetric Patient H&P    Chief Complaint: "Desires prenatal care"   History of Present Illness: Patient is a 30 y.o. [phone] Not Hispanic or Latino female, LMP 06/10/2020 presents with amenorrhea and positive home pregnancy test. Based on her  LMP, her EDD is Estimated Date of Delivery: 03/17/21 and her EGA is [redacted]w[redacted]d. Cycles are 4. days, regular, and occur approximately every : 28 days. Her last pap smear was about 1 years ago and was no abnormalities.    She had a urine pregnancy test which was positive about 2 week(s)  ago. Her last menstrual period was normal and lasted for  about 5 day(s). Since her LMP she claims she has experienced fatigue.. She denies vaginal bleeding. Her past medical history is noncontributory. Her prior pregnancies are notable for none. Her last delivery was just 5 months ago, she this pregnancy is closely spaced to the last. She has had her babies with [facility], and has two girls and one boy at home. She breastfed the first, but not the next two. All deliveries have been vaginal, and in the 8-9 lb range.  Since her LMP, she admits to the use of tobacco products  no She claims she has gained   10 pounds since the start of her pregnancy.  There are cats in the home in the home  no She admits close contact with children on a regular basis  yes  She has had chicken pox in the past yes She has had Tuberculosis exposures, symptoms, or previously tested positive for TB   no Current or past history of domestic violence. no  Genetic Screening/Teratology Counseling: (Includes patient, baby's father, or anyone in either family with:)   1. Patient's age >/= 56 [facility]  no 2. Thalassemia ([place], [place], Mediterranean, or Asian background): MCV<80  no 3. Neural tube defect (meningomyelocele, spina bifida, anencephaly)  no 4. Congenital heart defect  no  5. Down syndrome  no 6. Tay-Sachs (Jewish, [place]  no 7. Canavan's Disease  no 8. Sickle cell disease  or trait (African)  no  9. Hemophilia or other blood disorders  no  10. Muscular dystrophy  no  11. Cystic fibrosis  no  12. Huntington's Chorea  no  13. Mental retardation/autism  no 14. Other inherited genetic or chromosomal disorder  no 15. Maternal metabolic disorder (DM, PKU, etc)  no 16. Patient or FOB with a child with a birth defect not listed above no  16a. Patient or FOB with a birth defect themselves no 17. Recurrent pregnancy loss, or stillbirth  no  18. Any medications since LMP other than prenatal vitamins (include vitamins, supplements, OTC meds, drugs, alcohol)  Yes- she is a MJ user but has not had any MJ since she found out she was pregnant 19. Any other genetic/environmental exposure to discuss  no  Infection History:   1. Lives with someone with TB or TB exposed  no  2. Patient or partner has history of genital herpes  no 3. Rash or viral illness since LMP  no 4. History of STI (GC, CT, HPV, syphilis, HIV)  no 5. History of recent travel :  no  Other pertinent information:  yes - closely spaced pregnancies    Review of Systems:10 point review of systems negative unless otherwise noted in HPI  Past Medical History:  Past Medical History:  Diagnosis Date  . Anxiety   . No pertinent past medical history  Past Surgical History:  Past Surgical History:  Procedure Laterality Date  . NO PAST SURGERIES    . no surgical history      Gynecologic History: Patient's last menstrual period was 06/10/2020 (exact date).  Obstetric History: [id number]  Family History:  Family History  Problem Relation Age of Onset  . Healthy Mother   . Healthy Father   . Lupus Paternal Aunt   . Diabetes Paternal Aunt   . Fibroids Paternal Aunt   . Healthy Maternal Grandmother   . Dementia Maternal Grandfather   . Healthy Paternal Grandmother     Social History:  Social History   Socioeconomic History  . Marital status: Significant Other    Spouse name: Nigael  .  Number of children: Not on file  . Years of education: Not on file  . Highest education level: Not on file  Occupational History  . Not on file  Tobacco Use  . Smoking status: Never Smoker  . Smokeless tobacco: Never Used  Vaping Use  . Vaping Use: Never used  Substance and Sexual Activity  . Alcohol use: Not Currently  . Drug use: Never  . Sexual activity: Yes    Birth control/protection: Other-see comments    Comment: undecided  Other Topics Concern  . Not on file  Social History Narrative  . Not on file   Social Determinants of Health   Financial Resource Strain: Not on file  Food Insecurity: Not on file  Transportation Needs: Not on file  Physical Activity: Not on file  Stress: Not on file  Social Connections: Not on file  Intimate Partner Violence: Not on file    Allergies:  Allergies  Allergen Reactions  . Penicillins Hives    Medications: Prior to Admission medications   Not on File    Physical Exam Vitals: Weight 195 lb (88.5 kg), last menstrual period 06/10/2020, unknown if currently breastfeeding.  General: NAD HEENT: normocephalic, anicteric Thyroid: no enlargement, no palpable nodules Pulmonary: No increased work of breathing, CTAB Cardiovascular: RRR, distal pulses 2+ Abdomen: NABS, soft, non-tender, non-distended.  Umbilicus without lesions.  No hepatomegaly, splenomegaly or masses palpable. No evidence of hernia  Genitourinary:  External: Normal external female genitalia.  Normal urethral meatus, normal  Bartholin's and Skene's glands.    Vagina: Normal vaginal mucosa, no evidence of prolapse.    Cervix: Grossly normal in appearance, no bleeding  Uterus: anteverted Non-enlarged, mobile, normal contour.  No CMT  Adnexa: ovaries non-enlarged, no adnexal masses  Rectal: deferred Extremities: no edema, erythema, or tenderness Neurologic: Grossly intact Psychiatric: mood appropriate, affect full   Assessment: 30 y.o. G4P3003 at [redacted]w[redacted]d presenting  to initiate prenatal care  Plan: 1) Avoid alcoholic beverages. 2) Patient encouraged not to smoke.  3) Discontinue the use of all non-medicinal drugs and chemicals.  4) Take prenatal vitamins daily.  5) Nutrition, food safety (fish, cheese advisories, and high nitrite foods) and exercise discussed. 6) Hospital and practice style discussed with cross coverage system.  7) Genetic Screening, such as with 1st Trimester Screening, cell free fetal DNA, AFP testing, and Ultrasound, as well as with amniocentesis and CVS as appropriate, is discussed with patient. At the conclusion of today's visit patient requested genetic testing 8) Patient is asked about travel to areas at risk for the Zika virus, and counseled to avoid travel and exposure to mosquitoes or sexual partners who may have themselves been exposed to the virus. Testing is discussed, and will be ordered as appropriate.   Discussed  bbenefits of breastfeeding and also briefly addressed plan for contraception after this next baby. RTC in 4 weeks for blood work, and ROB. Dating scan ordered.  Mirna Mires, CNM  08/04/2020 10:46 AM

## 2020-08-04 NOTE — Progress Notes (Signed)
Positive home pregnancy test 07/12/20.

## 2020-08-05 ENCOUNTER — Encounter: Payer: Self-pay | Admitting: Obstetrics

## 2020-08-05 DIAGNOSIS — Z3400 Encounter for supervision of normal first pregnancy, unspecified trimester: Secondary | ICD-10-CM | POA: Insufficient documentation

## 2020-08-05 DIAGNOSIS — Z3480 Encounter for supervision of other normal pregnancy, unspecified trimester: Secondary | ICD-10-CM | POA: Insufficient documentation

## 2020-08-05 LAB — CYTOLOGY - PAP
Chlamydia: NEGATIVE
Comment: NEGATIVE
Comment: NEGATIVE
Comment: NORMAL
Diagnosis: NEGATIVE
Neisseria Gonorrhea: NEGATIVE
Trichomonas: NEGATIVE

## 2020-08-06 LAB — URINE CULTURE, OB REFLEX

## 2020-08-06 LAB — CULTURE, OB URINE

## 2020-09-02 ENCOUNTER — Ambulatory Visit (INDEPENDENT_AMBULATORY_CARE_PROVIDER_SITE_OTHER): Payer: Medicaid Other | Admitting: Obstetrics and Gynecology

## 2020-09-02 ENCOUNTER — Other Ambulatory Visit: Payer: Self-pay

## 2020-09-02 ENCOUNTER — Encounter: Payer: Self-pay | Admitting: Obstetrics and Gynecology

## 2020-09-02 ENCOUNTER — Telehealth: Payer: Self-pay

## 2020-09-02 VITALS — BP 120/80 | Wt 190.0 lb

## 2020-09-02 DIAGNOSIS — O021 Missed abortion: Secondary | ICD-10-CM | POA: Diagnosis not present

## 2020-09-02 NOTE — Progress Notes (Signed)
Routine Prenatal Care Visit  Subjective  Diana Kirby Patient is a 31 y.o. G4P3003 at [redacted]w[redacted]d being seen today for ongoing prenatal care.  She is currently monitored for the following issues for this low-risk pregnancy and has Anxiety and depression and Supervision of other normal pregnancy, antepartum on their problem list.  ----------------------------------------------------------------------------------- Patient reports no complaints.    . Vag. Bleeding: None.   . Leaking Fluid denies.  ----------------------------------------------------------------------------------- The following portions of the patient's history were reviewed and updated as appropriate: allergies, current medications, past family history, past medical history, past social history, past surgical history and problem list. Problem list updated.  Objective  Blood pressure 120/80, weight 190 lb (86.2 kg), last menstrual period 06/10/2020, unknown if currently breastfeeding. Pregravid weight 175 lb (79.4 kg) Total Weight Gain 15 lb (6.804 kg) Urinalysis: Urine Protein    Urine Glucose    Fetal Status: Fetal Heart Rate (bpm): 0         General:  Alert, oriented and cooperative. Patient is in no acute distress.  Skin: Skin is warm and dry. No rash noted.   Cardiovascular: Normal heart rate noted  Respiratory: Normal respiratory effort, no problems with respiration noted  Abdomen: Soft, gravid, appropriate for gestational age.       Pelvic:  Cervical exam deferred        Extremities: Normal range of motion.     Mental Status: Normal mood and affect. Normal behavior. Normal judgment and thought content.   Assessment   30 y.o. G4P3003 at [redacted]w[redacted]d by  03/17/2021, by Last Menstrual Period presenting for routine prenatal visit  Plan   Pregnancy #4 Problems (from 08/04/20 to present)    Problem Noted Resolved   Supervision of other normal pregnancy, antepartum 08/05/2020 by Mirna Mires, CNM No   Overview Addendum 09/02/2020  11:29 AM by Conard Novak, MD     Clinic  Prenatal Labs  Dating  Blood type: --/--/B POS Performed at [facility], [address] [place], [place] [address]  [phone]/02 1701)   Genetic Screen 1 Screen:    AFP:     Quad:     NIPS: Antibody:NEG (11/02 1516)  Anatomic [place]  Rubella:    GTT Early:               Third trimester:  RPR: NON REACTIVE (11/02 1516)   Flu vaccine  HBsAg:     TDaP vaccine                                               Rhogam: HIV:     Baby Food                                               GBS:--/Negative (10/06 1553)(For PCN allergy, check sensitivities)  Contraception  ION:GEXB  Circumcision    Pediatrician    Support Person     Closely spaced pregnancies- 46 month old at home when this pregnancy occurred. Hx of multiple SABs.         Previous Version      Bedside transvaginal ultrasound confirms missed abortion.  See imaging report under NOTES from today.    Condolences were offered to the patient and her family.  I stressed that while emotionally difficult, that this did not occur because of an actions or inactions by the patient.  Somewhere between 10-20% of identified first trimester pregnancies will unfortunately end in miscarriage.  Given this relatively high incidence rate, further diagnostic testing such as chromosome analysis is generally not clinically relevant nor recommended.  Although the chromosomal abnormalities have been implicated at rates as high as 70% in some studies, these are generally random and do not infer and increased risk of recurrence with subsequent pregnancies.  However, 3 or more consecutive first trimester losses are relatively uncommon, and these patient generally do benefit from additional work up to determine a potential modifiable etiology.   We briefly discussed management options including expectant management, medical management, and surgical management as well as their relative success rates and complications.  Approximately 80% of first trimester miscarriages will pass successfully but may require a time frame of up to 8 weeks (ACOG Practice Bulletin 150 May 2015 "Early Pregnancy Loss").  Medical management using of misoprostil administered every 3-hrs as needed for up to 3 doses speeds up the time frame to completion significantly, has literature supporting its use up to 63 days or [redacted]w[redacted]d gestation and results in a passage rate of 84-85% (ACOG Practice Bulletin 143 March 2014 "Medical Management of First-Trimester Abortion").  Dilation and curettage has the highest rate of uterine evacuation, but carries with is operative cost, surgical and anesthetic risk.  While these risk are relatively small they nevertheless include infection, bleeding, uterine perforation, formation of uterine synechia, and in rare cases death.   We discussed repeat ultrasound and or trending HCG levels if the patient wishes to pursue these prior to making her decision.  Clinically I am confident of the diagnosis, but I do not want any doubts in the patient's mind regarding the plan of management she chooses to adopt.  I will allow the patient and her family to discuss management options and she was advised to contact the office to arrange final disposition one she has made her decision or should she have any follow up questions for myself.    The patient is Rh positive and rhogam is therefore not indicated.    She elects surgical management as soon as possible. Since I'm out of the office next week, I offered for her to have the surgery with one of my partners. She accepted. So, I'll try to schedule it with the first one available.     Thomasene Mohair, MD, [facility], [facility] 09/02/2020 6:03 PM

## 2020-09-02 NOTE — Telephone Encounter (Signed)
Called patient to schedule suction D&C w Jerene Pitch  DOS 5/31  H&P  DOS  Pre-admit appointment - advised [facility] to [facility] at 10:30  Advised that pt may also receive calls from the hospital pharmacy and pre-service center.  Confirmed pt has Medicaid as primary insurance. No secondary insurance.

## 2020-09-02 NOTE — Telephone Encounter (Signed)
-----   Message from Conard Novak, MD sent at 09/02/2020 12:22 PM EDT ----- Regarding: Schedule surgery Surgery Booking Request Patient Full Name:  Diana Kirby  MRN: [id number]  DOB: 06/09/1990  Surgeon: Tiajuana Amass available surgeon Requested Surgery Date and Time: first available Primary Diagnosis AND Code: missed abortion [O02.1] Secondary Diagnosis and Code:  Surgical Procedure: Suction D&C RNFA Requested?: No L&D Notification: No Admission Status: same day surgery Length of Surgery: 50 min Special Case Needs: No H&P: Yes (surgeon preference) Phone Interview???:  Yes Interpreter: No Medical Clearance:  No Special Scheduling Instructions: ASAP Any known health/anesthesia issues, diabetes, sleep apnea, latex allergy, defibrillator/pacemaker?: No Acuity: P3   (P1 highest, P2 delay may cause harm, P3 low, elective gyn, P4 lowest)

## 2020-09-06 ENCOUNTER — Other Ambulatory Visit: Payer: Self-pay

## 2020-09-06 ENCOUNTER — Encounter: Payer: Self-pay | Admitting: Obstetrics and Gynecology

## 2020-09-06 ENCOUNTER — Encounter
Admission: RE | Disposition: A | Discharge status: Home / Self Care | Payer: Self-pay | Source: Home / Self Care | Attending: Obstetrics and Gynecology

## 2020-09-06 ENCOUNTER — Ambulatory Visit: Payer: Medicaid Other | Admitting: Certified Registered Nurse Anesthetist

## 2020-09-06 ENCOUNTER — Ambulatory Visit
Admission: RE | Admit: 2020-09-06 | Discharge: 2020-09-06 | Disposition: A | Discharge status: Home / Self Care | Payer: Medicaid Other | Attending: Obstetrics and Gynecology | Admitting: Obstetrics and Gynecology

## 2020-09-06 DIAGNOSIS — O021 Missed abortion: Secondary | ICD-10-CM | POA: Insufficient documentation

## 2020-09-06 DIAGNOSIS — Z880 Allergy status to penicillin: Secondary | ICD-10-CM | POA: Diagnosis not present

## 2020-09-06 DIAGNOSIS — O411010 Infection of amniotic sac and membranes, unspecified, first trimester, not applicable or unspecified: Secondary | ICD-10-CM | POA: Insufficient documentation

## 2020-09-06 DIAGNOSIS — Z3A12 12 weeks gestation of pregnancy: Secondary | ICD-10-CM | POA: Insufficient documentation

## 2020-09-06 HISTORY — PX: DILATION AND EVACUATION: SHX1459

## 2020-09-06 LAB — TYPE AND SCREEN
ABO/RH(D): B POS
Antibody Screen: NEGATIVE

## 2020-09-06 LAB — CBC
HCT: 35.9 % — ABNORMAL LOW (ref 36.0–46.0)
Hemoglobin: 11.7 g/dL — ABNORMAL LOW (ref 12.0–15.0)
MCH: 27.9 pg (ref 26.0–34.0)
MCHC: 32.6 g/dL (ref 30.0–36.0)
MCV: 85.7 fL (ref 80.0–100.0)
Platelets: 254 10*3/uL (ref 150–400)
RBC: 4.19 MIL/uL (ref 3.87–5.11)
RDW: 13.9 % (ref 11.5–15.5)
WBC: 6.8 10*3/uL (ref 4.0–10.5)
nRBC: 0 % (ref 0.0–0.2)

## 2020-09-06 SURGERY — DILATION AND EVACUATION, UTERUS
Anesthesia: General

## 2020-09-06 MED ORDER — LIDOCAINE HCL (CARDIAC) PF 100 MG/5ML IV SOSY
PREFILLED_SYRINGE | INTRAVENOUS | Status: DC | PRN
  Administered 2020-09-06: 80 mg via INTRAVENOUS

## 2020-09-06 MED ORDER — DOXYCYCLINE HYCLATE 100 MG IV SOLR
200.0000 mg | Freq: Once | INTRAVENOUS | Status: AC
  Administered 2020-09-06: 200 mg via INTRAVENOUS
  Filled 2020-09-06 (×2): qty 200

## 2020-09-06 MED ORDER — IBUPROFEN 600 MG PO TABS: 600.0000 mg | ORAL_TABLET | Freq: Four times a day (QID) | ORAL | 0 refills | Status: DC | PRN

## 2020-09-06 MED ORDER — MEPERIDINE HCL 25 MG/ML IJ SOLN: 6.2500 mg | INTRAMUSCULAR | Status: DC | PRN

## 2020-09-06 MED ORDER — DEXAMETHASONE SODIUM PHOSPHATE 10 MG/ML IJ SOLN
INTRAMUSCULAR | Status: DC | PRN
  Administered 2020-09-06: 10 mg via INTRAVENOUS

## 2020-09-06 MED ORDER — ORAL CARE MOUTH RINSE
15.0000 mL | Freq: Once | OROMUCOSAL | Status: AC
Start: 2020-09-06 — End: 2020-09-06

## 2020-09-06 MED ORDER — CHLORHEXIDINE GLUCONATE 0.12 % MT SOLN: 15.0000 mL | Freq: Once | OROMUCOSAL | Status: AC

## 2020-09-06 MED ORDER — CHLORHEXIDINE GLUCONATE 0.12 % MT SOLN
OROMUCOSAL | Status: AC
  Administered 2020-09-06: 15 mL via OROMUCOSAL
  Filled 2020-09-06: qty 15

## 2020-09-06 MED ORDER — PROPOFOL 10 MG/ML IV BOLUS
INTRAVENOUS | Status: DC | PRN
  Administered 2020-09-06: 180 mg via INTRAVENOUS

## 2020-09-06 MED ORDER — FENTANYL CITRATE (PF) 100 MCG/2ML IJ SOLN
INTRAMUSCULAR | Status: AC
  Administered 2020-09-06: 25 ug via INTRAVENOUS
  Filled 2020-09-06: qty 2

## 2020-09-06 MED ORDER — LACTATED RINGERS IV SOLN: INTRAVENOUS | Status: DC

## 2020-09-06 MED ORDER — OXYCODONE HCL 5 MG PO TABS
5.0000 mg | ORAL_TABLET | Freq: Once | ORAL | Status: AC | PRN
Start: 2020-09-06 — End: 2020-09-06
  Administered 2020-09-06: 5 mg via ORAL

## 2020-09-06 MED ORDER — FAMOTIDINE 20 MG PO TABS: 20.0000 mg | ORAL_TABLET | Freq: Once | ORAL | Status: AC

## 2020-09-06 MED ORDER — PROMETHAZINE HCL 25 MG/ML IJ SOLN: 6.2500 mg | INTRAMUSCULAR | Status: DC | PRN

## 2020-09-06 MED ORDER — ONDANSETRON HCL 4 MG/2ML IJ SOLN
INTRAMUSCULAR | Status: DC | PRN
  Administered 2020-09-06: 4 mg via INTRAVENOUS

## 2020-09-06 MED ORDER — MIDAZOLAM HCL 2 MG/2ML IJ SOLN
INTRAMUSCULAR | Status: AC
  Filled 2020-09-06: qty 2

## 2020-09-06 MED ORDER — FENTANYL CITRATE (PF) 100 MCG/2ML IJ SOLN
INTRAMUSCULAR | Status: AC
  Filled 2020-09-06: qty 2

## 2020-09-06 MED ORDER — MIDAZOLAM HCL 2 MG/2ML IJ SOLN
INTRAMUSCULAR | Status: DC | PRN
  Administered 2020-09-06: 2 mg via INTRAVENOUS

## 2020-09-06 MED ORDER — PHENYLEPHRINE HCL (PRESSORS) 10 MG/ML IV SOLN
INTRAVENOUS | Status: DC | PRN
  Administered 2020-09-06 (×3): 100 ug via INTRAVENOUS

## 2020-09-06 MED ORDER — OXYCODONE HCL 5 MG/5ML PO SOLN: 5.0000 mg | Freq: Once | ORAL | Status: AC | PRN

## 2020-09-06 MED ORDER — FENTANYL CITRATE (PF) 100 MCG/2ML IJ SOLN
INTRAMUSCULAR | Status: DC | PRN
  Administered 2020-09-06: 25 ug via INTRAVENOUS
  Administered 2020-09-06: 50 ug via INTRAVENOUS
  Administered 2020-09-06: 25 ug via INTRAVENOUS

## 2020-09-06 MED ORDER — GLYCOPYRROLATE 0.2 MG/ML IJ SOLN
INTRAMUSCULAR | Status: DC | PRN
  Administered 2020-09-06: .2 mg via INTRAVENOUS

## 2020-09-06 MED ORDER — OXYCODONE HCL 5 MG PO TABS
ORAL_TABLET | ORAL | Status: AC
  Filled 2020-09-06: qty 1

## 2020-09-06 MED ORDER — PROPOFOL 10 MG/ML IV BOLUS
INTRAVENOUS | Status: AC
  Filled 2020-09-06: qty 20

## 2020-09-06 MED ORDER — FAMOTIDINE 20 MG PO TABS
ORAL_TABLET | ORAL | Status: AC
  Administered 2020-09-06: 20 mg via ORAL
  Filled 2020-09-06: qty 1

## 2020-09-06 MED ORDER — ACETAMINOPHEN 10 MG/ML IV SOLN
INTRAVENOUS | Status: DC | PRN
  Administered 2020-09-06: 1000 mg via INTRAVENOUS

## 2020-09-06 MED ORDER — TRAMADOL HCL 50 MG PO TABS: 50.0000 mg | ORAL_TABLET | Freq: Four times a day (QID) | ORAL | 0 refills | Status: DC | PRN

## 2020-09-06 MED ORDER — FENTANYL CITRATE (PF) 100 MCG/2ML IJ SOLN
25.0000 ug | INTRAMUSCULAR | Status: DC | PRN
  Administered 2020-09-06: 25 ug via INTRAVENOUS

## 2020-09-06 MED ORDER — POVIDONE-IODINE 10 % EX SWAB: 2.0000 "application " | Freq: Once | CUTANEOUS | Status: DC

## 2020-09-06 SURGICAL SUPPLY — 19 items
BAG COUNTER SPONGE EZ (MISCELLANEOUS) ×2 IMPLANT
BAG SPNG 4X4 CLR HAZ (MISCELLANEOUS) ×1
CATH ROBINSON RED A/P 16FR (CATHETERS) ×2 IMPLANT
FILTER UTR ASPR SPEC (MISCELLANEOUS) ×1 IMPLANT
FLTR UTR ASPR SPEC (MISCELLANEOUS) ×2
GLOVE SURG SYN 6.5 ES PF (GLOVE) ×4 IMPLANT
GLOVE SURG UNDER POLY LF SZ6.5 (GLOVE) ×4 IMPLANT
GOWN STRL REUS W/ TWL LRG LVL3 (GOWN DISPOSABLE) ×2 IMPLANT
GOWN STRL REUS W/TWL LRG LVL3 (GOWN DISPOSABLE) ×4
KIT BERKELEY 1ST TRIMESTER 3/8 (MISCELLANEOUS) ×2 IMPLANT
KIT TURNOVER CYSTO (KITS) ×2 IMPLANT
MANIFOLD NEPTUNE II (INSTRUMENTS) ×2 IMPLANT
NS IRRIG 500ML POUR BTL (IV SOLUTION) ×2 IMPLANT
PACK DNC HYST (MISCELLANEOUS) ×2 IMPLANT
PAD OB MATERNITY 4.3X12.25 (PERSONAL CARE ITEMS) ×2 IMPLANT
PAD PREP 24X41 OB/GYN DISP (PERSONAL CARE ITEMS) ×2 IMPLANT
SET BERKELEY SUCTION TUBING (SUCTIONS) ×2 IMPLANT
TOWEL OR 17X26 4PK STRL BLUE (TOWEL DISPOSABLE) ×2 IMPLANT
VACURETTE 8MM F TIP (MISCELLANEOUS) ×2 IMPLANT

## 2020-09-06 NOTE — OR Nursing (Signed)
States the IV is better.  Increased rate to 75cc/hour

## 2020-09-06 NOTE — Anesthesia Postprocedure Evaluation (Signed)
Anesthesia Post Note  Patient: Diana Kirby  Procedure(s) Performed: DILATATION AND EVACUATION (N/A )  Patient location during evaluation: PACU Anesthesia Type: General Level of consciousness: awake and alert and oriented Pain management: pain level controlled Vital Signs Assessment: post-procedure vital signs reviewed and stable Respiratory status: spontaneous breathing, nonlabored ventilation and respiratory function stable Cardiovascular status: blood pressure returned to baseline and stable Postop Assessment: no signs of nausea or vomiting Anesthetic complications: no   No complications documented.   Last Vitals:  Vitals:   09/06/20 1415 09/06/20 1442  BP: 95/62 109/69  Pulse: (!) 58 (!) 55  Resp: 16 16  Temp:  (!) 36.1 C  SpO2: 100% 100%    Last Pain:  Vitals:   09/06/20 1442  TempSrc:   PainSc: 9                  Reyaansh Merlo

## 2020-09-06 NOTE — OR Nursing (Signed)
Patient complained of discomfort at IV site.  Infusing rate reduced.  No signs of infiltration. Hand splint to reduce movement and warmth applied proximal to IV site.

## 2020-09-06 NOTE — OR Nursing (Signed)
Noted Secondary was clamped.  Opened and initiated.

## 2020-09-06 NOTE — Anesthesia Procedure Notes (Signed)
Procedure Name: LMA Insertion Date/Time: 09/06/2020 12:57 PM Performed by: Joanette Gula, Cornell Bourbon, CRNA Pre-anesthesia Checklist: Patient identified, Emergency Drugs available, Suction available and Patient being monitored Patient Re-evaluated:Patient Re-evaluated prior to induction Oxygen Delivery Method: Circle system utilized Preoxygenation: Pre-oxygenation with 100% oxygen Induction Type: IV induction Ventilation: Mask ventilation without difficulty LMA: LMA inserted LMA Size: 4.0 Number of attempts: 1 Placement Confirmation: positive ETCO2 and breath sounds checked- equal and bilateral Tube secured with: Tape Dental Injury: Teeth and Oropharynx as per pre-operative assessment

## 2020-09-06 NOTE — Discharge Instructions (Signed)
Dilation and Curettage or Vacuum Curettage, Care After This sheet gives you information about how to care for yourself after your procedure. Your doctor may also give you more specific instructions. If you have problems or questions, contact your doctor. What can I expect after the procedure? After the procedure, it is common to have:  Mild pain or cramping.  Some bleeding or spotting from the vagina. These may last for up to 2 weeks. Follow these instructions at home: Medicines  Take over-the-counter and prescription medicines only as told by your doctor. This is very important if you take blood-thinning medicine.  Ask your doctor if the medicine prescribed to you requires you to avoid driving or using machinery. Activity  If you were given a medicine to help you relax (sedative) during your procedure, it can affect you for many hours. Do not drive or use machinery until your doctor says that it is safe.  Rest as told by your doctor.  Do not sit for a long time without moving. Get up to take short walks every 1-2 hours. This is important. Ask for help if you feel weak or unsteady.  Do not lift anything that is heavier than 10 lb (4.5 kg), or the limit that you are told, until your doctor says that it is safe.  Return to your normal activities as told by your doctor. Ask your doctor what activities are safe for you.   Lifestyle For at least 2 weeks, or as long as told by your doctor:  Do not douche.  Do not use tampons.  Do not have sex. General instructions  Wear compression stockings as told by your doctor.  It is up to you to get the results of your procedure. Ask your doctor, or the department that is doing the procedure, when your results will be ready.  Keep all follow-up visits as told by your doctor. This is important. Contact a doctor if:  You have very bad cramps that get worse or do not get better with medicine.  You have very bad pain in your belly  (abdomen).  You cannot drink fluids without vomiting.  You have pain in a different part of your pelvis. The pelvis is the area just above your thighs.  You have fluid from your vagina that smells bad.  You have a rash. Get help right away if:  You are bleeding a lot from your vagina. A lot of bleeding means soaking more than one sanitary pad in 1 hour for 2 hours in a row.  You have a fever that is above 100.4F (38.0C).  Your belly feels very tender or hard.  You have chest pain.  You have trouble breathing.  You feel dizzy.  You feel light-headed.  You pass out (faint).  You have pain in your neck or shoulder area. These symptoms may be an emergency. Do not wait to see if the symptoms will go away. Get medical help right away. Call your local emergency services (911 in the [place] Do not drive yourself to the hospital. Summary  After your procedure, it is common to have pain or cramping. It is also common to have bleeding or spotting from your vagina.  Rest as told. Do not sit for a long time without moving. Get up to take short walks every 1-2 hours.  Do not lift anything that is heavier than 10 lb (4.5 kg), or the limit that you are told.  Contact your doctor if you have fluid from   your vagina that smells bad.  Get help right away if you develop any problems from the procedure. Ask your doctor what problems to watch for. This information is not intended to replace advice given to you by your health care provider. Make sure you discuss any questions you have with your health care provider. Document Revised: 04/28/2019 Document Reviewed: 04/28/2019 Elsevier Patient Education  2021 Elsevier Inc.   AMBULATORY SURGERY  DISCHARGE INSTRUCTIONS   1) The drugs that you were given will stay in your system until tomorrow so for the next 24 hours you should not:  A) Drive an automobile B) Make any legal decisions C) Drink any alcoholic beverage   2) You may resume  regular meals tomorrow.  Today it is better to start with liquids and gradually work up to solid foods.  You may eat anything you prefer, but it is better to start with liquids, then soup and crackers, and gradually work up to solid foods.   3) Please notify your doctor immediately if you have any unusual bleeding, trouble breathing, redness and pain at the surgery site, drainage, fever, or pain not relieved by medication.    4) Additional Instructions:    Please contact your physician with any problems or Same Day Surgery at [phone], Monday through Friday 6 am to 4 pm, or [facility] number at [phone]. 

## 2020-09-06 NOTE — OR Nursing (Signed)
Rate increased to 150cc/hr.  Tolerating this rate.

## 2020-09-06 NOTE — Transfer of Care (Signed)
Immediate Anesthesia Transfer of Care Note  Patient: Diana Kirby  Procedure(s) Performed: DILATATION AND EVACUATION (N/A )  Patient Location: PACU  Anesthesia Type:General  Level of Consciousness: awake, alert  and oriented  Airway & Oxygen Therapy: Patient Spontanous Breathing and Patient connected to face mask oxygen  Post-op Assessment: Report given to RN and Post -op Vital signs reviewed and stable  Post vital signs: Reviewed and stable  Last Vitals:  Vitals Value Taken Time  BP 116/81 09/06/20 1333  Temp    Pulse 82 09/06/20 1336  Resp 13 09/06/20 1336  SpO2 100 % 09/06/20 1336  Vitals shown include unvalidated device data.  Last Pain:  Vitals:   09/06/20 1119  TempSrc: Oral  PainSc: 0-No pain         Complications: No complications documented.

## 2020-09-06 NOTE — Anesthesia Preprocedure Evaluation (Signed)
Anesthesia Evaluation  Patient identified by MRN, date of birth, ID band Patient awake    Reviewed: Allergy & Precautions, NPO status , Patient's Chart, lab work & pertinent test results  History of Anesthesia Complications Negative for: history of anesthetic complications  Airway Mallampati: II  TM Distance: >3 FB Neck ROM: Full    Dental no notable dental hx.    Pulmonary neg pulmonary ROS, neg sleep apnea, neg COPD,    breath sounds clear to auscultation- rhonchi (-) wheezing      Cardiovascular Exercise Tolerance: Good (-) hypertension(-) CAD and (-) Past MI  Rhythm:Regular Rate:Normal - Systolic murmurs and - Diastolic murmurs    Neuro/Psych negative neurological ROS  negative psych ROS   GI/Hepatic negative GI ROS, Neg liver ROS,   Endo/Other  negative endocrine ROSneg diabetes  Renal/GU negative Renal ROS     Musculoskeletal negative musculoskeletal ROS (+)   Abdominal (+) + obese,   Peds  Hematology negative hematology ROS (+)   Anesthesia Other Findings   Reproductive/Obstetrics (+) Pregnancy (missed AB)                            Anesthesia Physical Anesthesia Plan  ASA: II  Anesthesia Plan: General   Post-op Pain Management:    Induction: Intravenous  PONV Risk Score and Plan: 2 and Ondansetron, Dexamethasone and Midazolam  Airway Management Planned: LMA  Additional Equipment:   Intra-op Plan:   Post-operative Plan:   Informed Consent: I have reviewed the patients History and Physical, chart, labs and discussed the procedure including the risks, benefits and alternatives for the proposed anesthesia with the patient or authorized representative who has indicated his/her understanding and acceptance.     Dental advisory given  Plan Discussed with: CRNA and Anesthesiologist  Anesthesia Plan Comments:         Anesthesia Quick Evaluation  

## 2020-09-06 NOTE — H&P (Signed)
Diana Kirby is an 30 y.o. female.   Chief Complaint: Missed abortion HPI: She presented today for a D&E which was scheduled by  My partner for a missed abortion. He diagnosed this on 09/02/2020 with a bedside transvaginal US. His photos are loaded below. She has no complaints. She denies vaginal bleeding or cramping. This is the first pregnancy loss for the patient.  My bedside abdominal US in pre-op confirms the loss of the pregnancy. IUP and CRL seen, no fetal heart tones seen.   OB History  Gravida Para Term Preterm AB Living  4 3 3  0 0 3  SAB IAB Ectopic Multiple Live Births        0 3    # Outcome Date GA Lbr Len/2nd Weight Sex Delivery Anes PTL Lv  4 Current           3 Term 02/09/20 [redacted]w[redacted]d 25:14 / 00:35 3900 g F Vag-Spont EPI  LIV  2 Term 07/13/11 [redacted]w[redacted]d  3345 g F Vag-Spont  N LIV  1 Term 12/13/09 [redacted]w[redacted]d  3799 g M Vag-Spont EPI N LIV     Past Medical History:  Diagnosis Date  . Anxiety   . No pertinent past medical history     Past Surgical History:  Procedure Laterality Date  . NO PAST SURGERIES    . no surgical history      Family History  Problem Relation Age of Onset  . Healthy Mother   . Healthy Father   . Lupus Paternal Aunt   . Diabetes Paternal Aunt   . Fibroids Paternal Aunt   . Healthy Maternal Grandmother   . Dementia Maternal Grandfather   . Healthy Paternal Grandmother    Social History:  reports that she has never smoked. She has never used smokeless tobacco. She reports previous alcohol use. She reports that she does not use drugs.  Allergies:  Allergies  Allergen Reactions  . Penicillins Hives    No medications prior to admission.    No results found for this or any previous visit (from the past 48 hour(s)). No results found.  Review of Systems  Constitutional: Negative for chills, fatigue and fever.  HENT: Negative for congestion, hearing loss and sinus pain.   Respiratory: Negative for cough, shortness of breath and wheezing.    Cardiovascular: Negative for chest pain, palpitations and leg swelling.  Gastrointestinal: Negative for abdominal pain, constipation, diarrhea, nausea and vomiting.  Genitourinary: Negative for dysuria, flank pain, frequency, hematuria and urgency.  Musculoskeletal: Negative for back pain.  Skin: Negative for rash.  Neurological: Negative for dizziness and headaches.  Psychiatric/Behavioral: Negative for suicidal ideas. The patient is not nervous/anxious.     Blood pressure 105/65, pulse 77, temperature (!) 97.1 F (36.2 C), temperature source Oral, resp. rate 16, height 5\' 3"  (1.6 m), weight 88.5 kg, last menstrual period 06/10/2020, SpO2 100 %, unknown if currently breastfeeding. Physical Exam Vitals and nursing note reviewed.  Constitutional:      Appearance: She is well-developed.  HENT:     Head: Normocephalic and atraumatic.  Cardiovascular:     Rate and Rhythm: Normal rate and regular rhythm.  Pulmonary:     Effort: Pulmonary effort is normal.     Breath sounds: Normal breath sounds.  Abdominal:     General: Bowel sounds are normal.     Palpations: Abdomen is soft.  Musculoskeletal:        General: Normal range of motion.  Skin:    General: Skin is  warm and dry.  Neurological:     Mental Status: She is alert and oriented to person, place, and time.  Psychiatric:        Behavior: Behavior normal.        Thought Content: Thought content normal.        Judgment: Judgment normal.    Transvaginal US images. Transvaginal US was performed by Dr. Thomasene Mohair:          Assessment/Plan 30 yo G4P300# [redacted]w[redacted]d with a missed abortion. We discussed options for a D&E versus expectant management and cytotec. she would like to proceed with a D&E. Reviewed complications such as hemorrhage, infection, uterine perforation, retained tissue or uterine scarring are possible but not common.  B POS Performed at [facility], [address] [place], [place]  [address]  Plan to follow up in 1 week.   Natale Milch, MD 09/06/2020, 12:00 PM

## 2020-09-06 NOTE — Op Note (Signed)
  Operative Note  09/06/2020 1:30 PM  PRE-OP DIAGNOSIS: Missed Abortion   POST-OP DIAGNOSIS: same  SURGEON: Fatou Dunnigan MD  ANESTHESIA: Choice   PROCEDURE: Procedure(s): DILATATION AND EVACUATION   ESTIMATED BLOOD LOSS: 300 mL   SPECIMENS: POC   COMPLICATIONS: none  DISPOSITION: PACU - hemodynamically stable.  CONDITION: stable  FINDINGS: Exam under anesthesia revealed a 12 wk size uterus without palpable adnexal masses.   INDICATION FOR PROCEDURE: missed abortion  PROCEDURE IN DETAIL: After informed consent was obtained, the patient was taken to the operating room where anesthesia was obtained without difficulty. The patient was positioned in the dorsal lithotomy position with [employer]. Time out was performed and an exam under anesthesia was performed. The vagina, perineum, and lower abdomen were prepped and draped in a normal sterile fashion. The bladder was emptied with an I&O catheter. A speculum was placed into the vagina and the cervix was grasped with a single toothed tenaculum.  The cervix was gently dilated to 38 [place] with  [employer] dilators. The suction was then tested and found to be adequate, and a 8 flexible suction cannula was advanced into the uterine cavity. The suction was activated and the contents of the uterus were aspirated until no further tissue was obtained. The uterus was then curetted to gritty texture throughout.  At the end of the procedure bleeding was noted to be minimal.  All instruments were then removed from the vagina.The patient tolerated the procedure well. All sponge, instrument, and needle counts were correct. The patient was taken to the recovery room in good condition.   Adelene Idler MD [facility], [facility] 09/06/2020 1:30 PM

## 2020-09-07 ENCOUNTER — Encounter: Payer: Self-pay | Admitting: Obstetrics and Gynecology

## 2020-09-09 LAB — SURGICAL PATHOLOGY

## 2021-02-11 IMAGING — US US OB < 14 WEEKS - US OB TV
1 series · 14 of 28 positions shown · non-contrast
Comparison: None.

CLINICAL DATA: Pelvic pain and vomiting.  Positive pregnancy test.

EXAM:
OBSTETRIC <14 WK US AND TRANSVAGINAL OB US
TECHNIQUE: Both transabdominal and transvaginal ultrasound examinations were
performed for complete evaluation of the gestation as well as the
maternal uterus, adnexal regions, and pelvic cul-de-sac.
Transvaginal technique was performed to assess early pregnancy.

[Series 1: us ob < 14 weeks - us ob tv · 0.22mm/px · 14 of 113 slices shown]
[im 5/113]
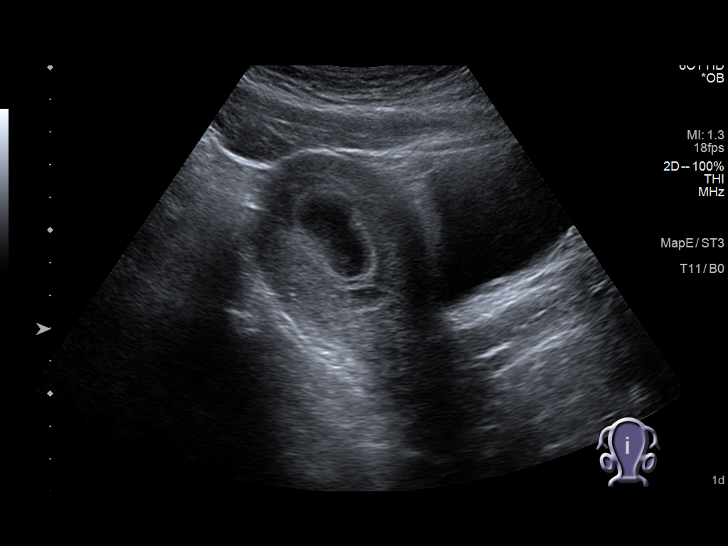
[im 13/113]
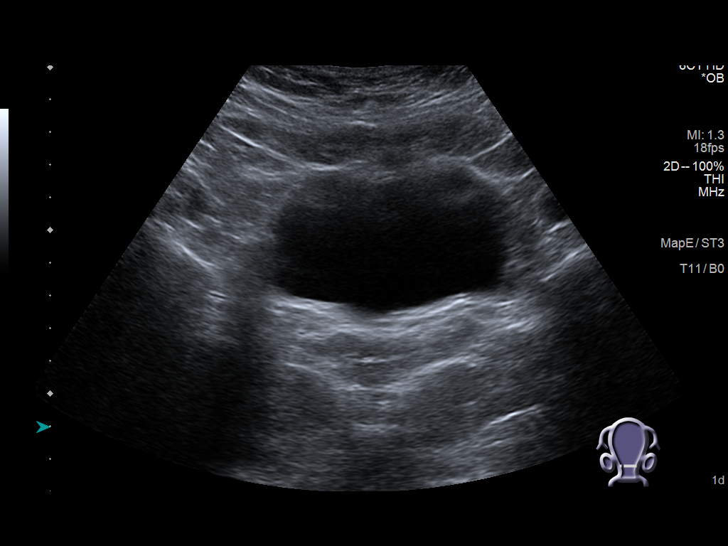
[im 21/113]
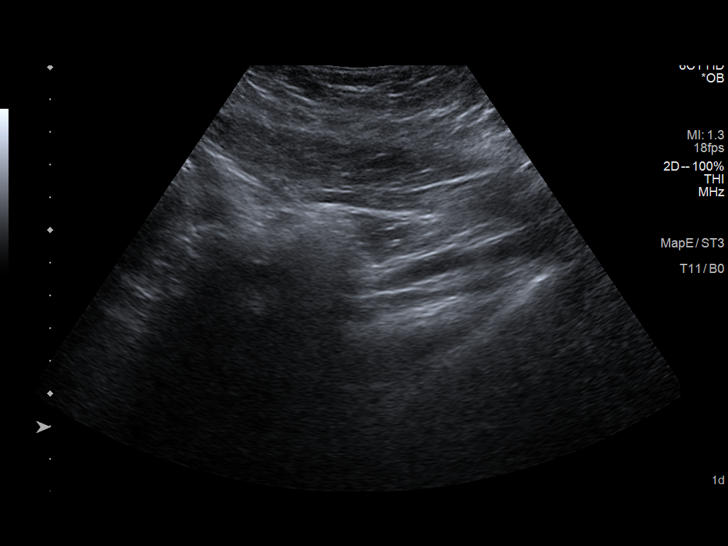
[im 30/113]
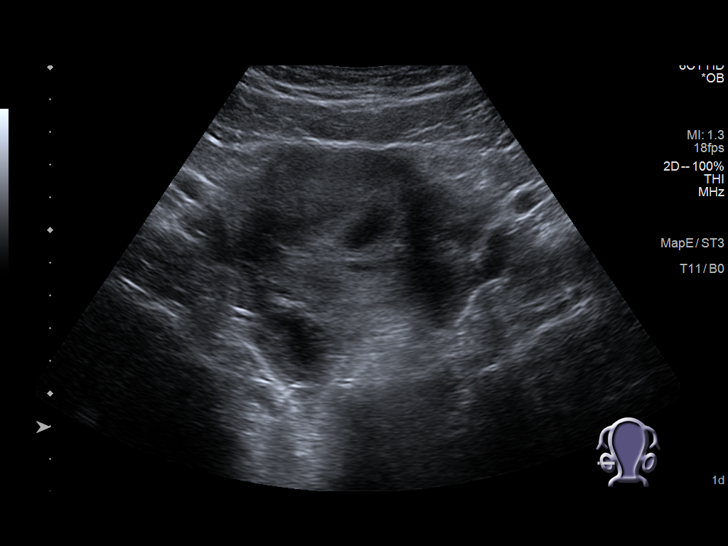
[im 38/113]
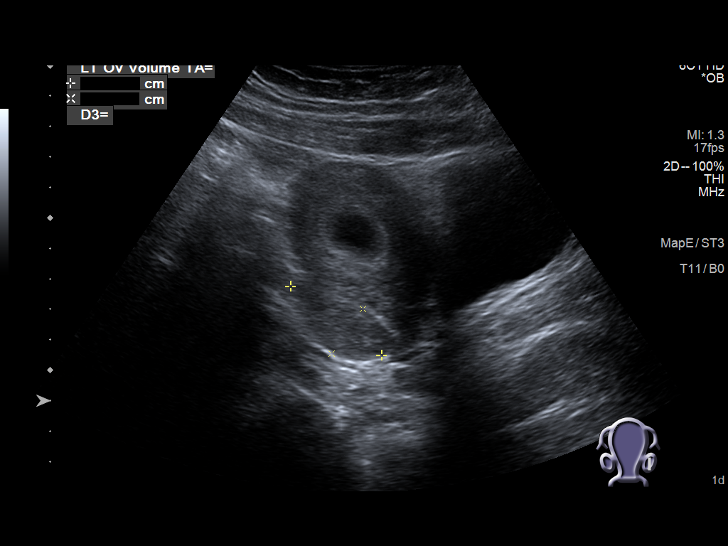
[im 46/113]
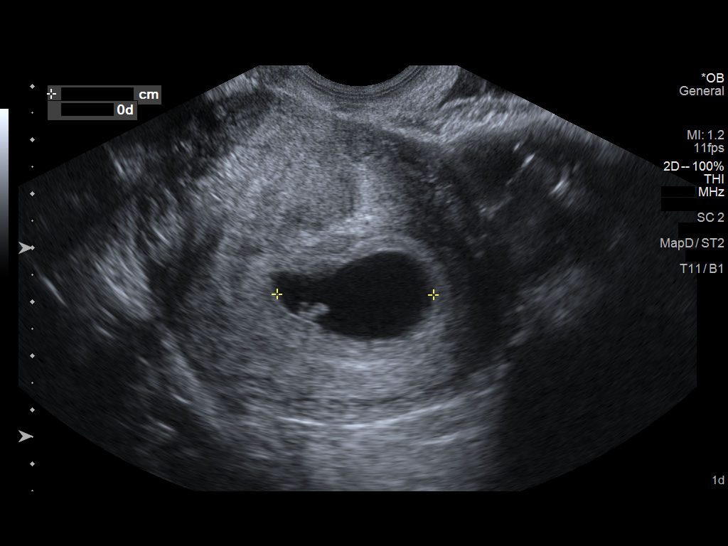
[im 54/113]
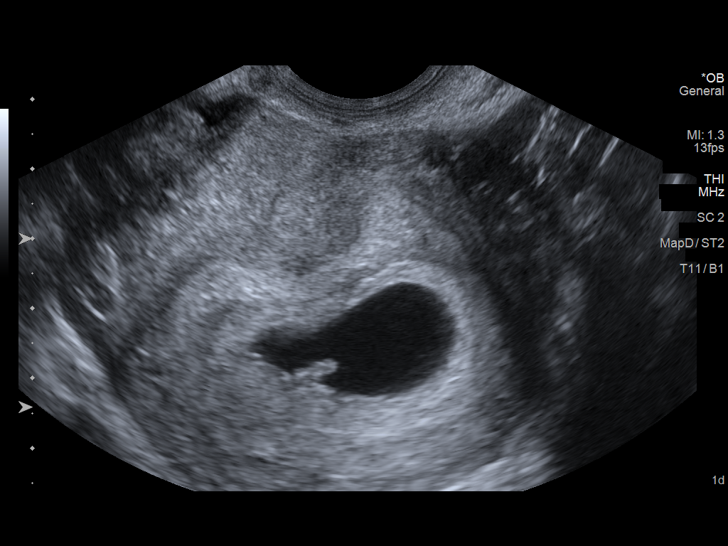
[im 63/113]
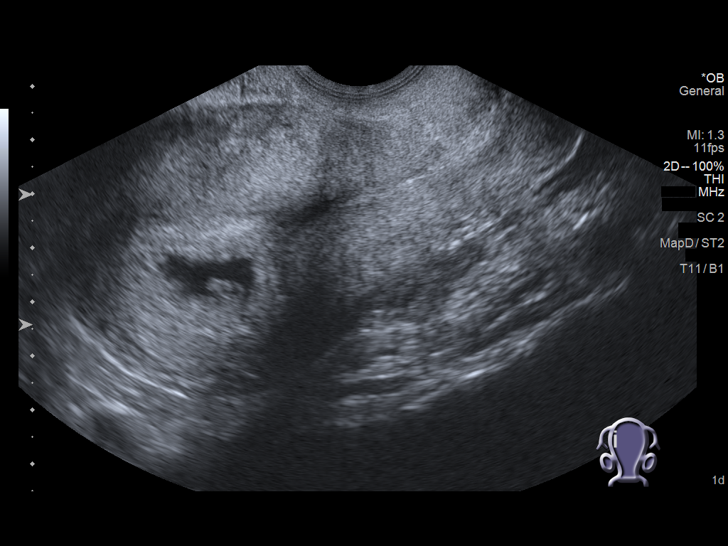
[im 71/113]
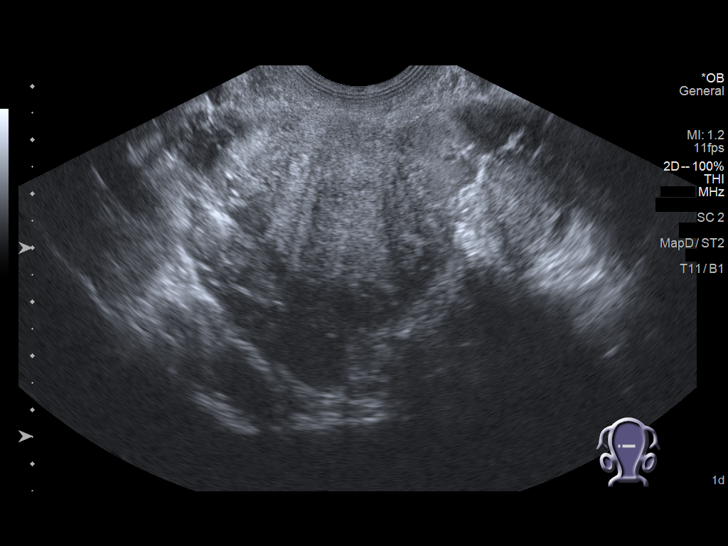
[im 79/113]
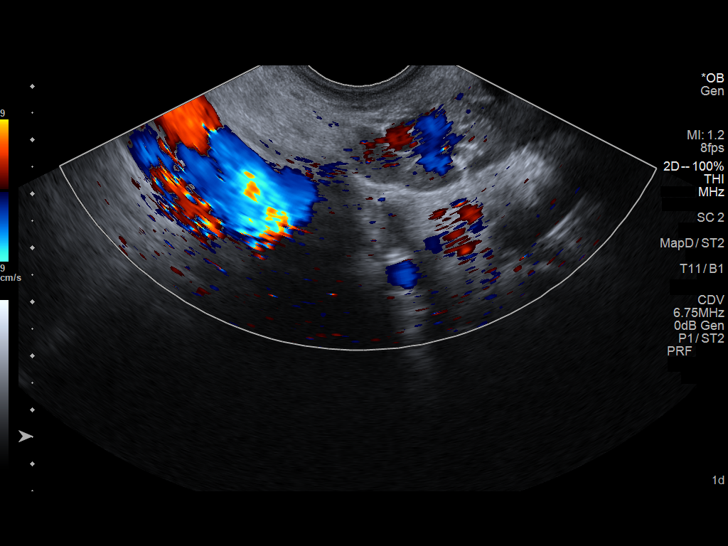
[im 88/113]
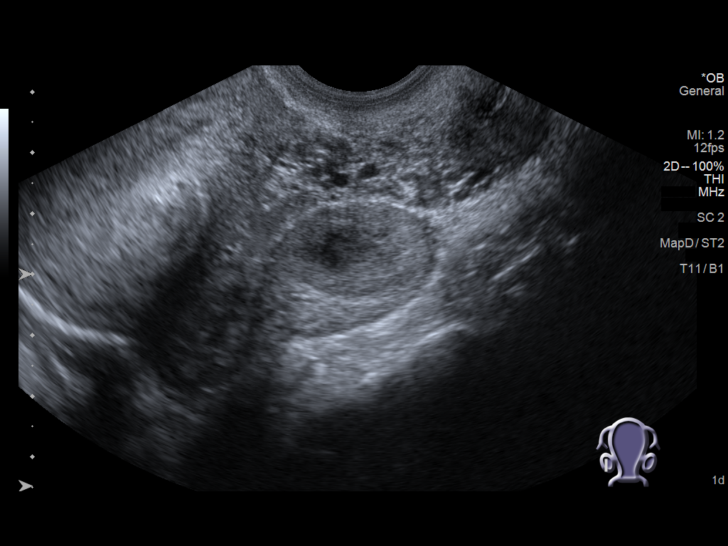
[im 96/113]
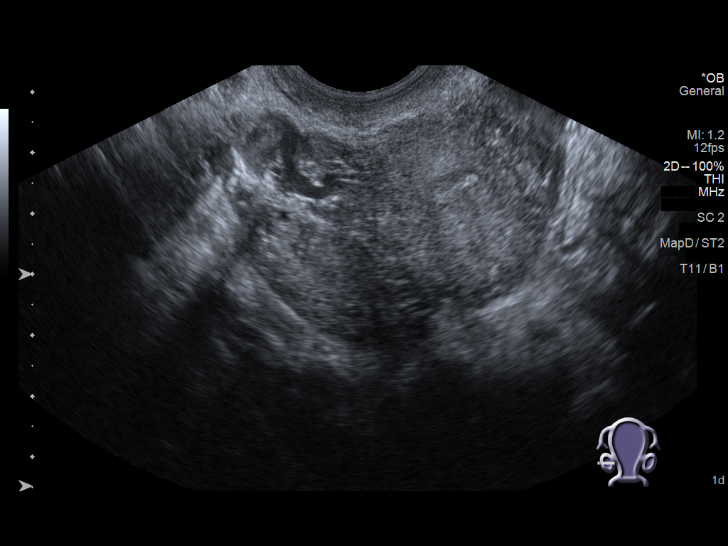
[im 104/113]
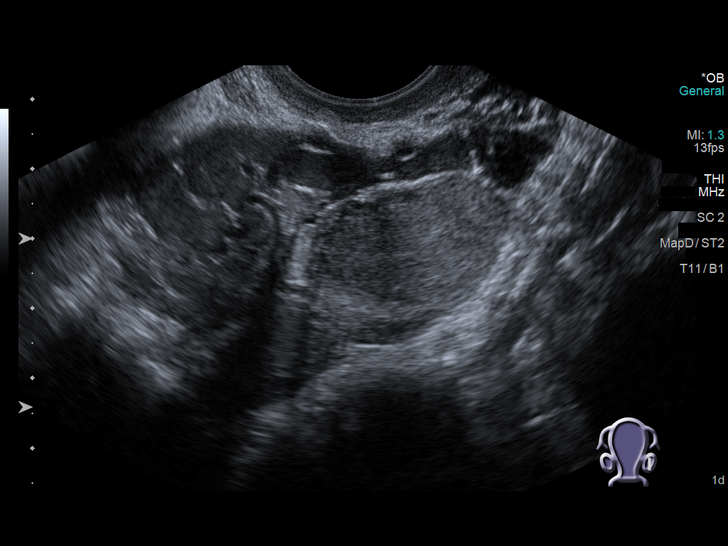
[im 113/113]
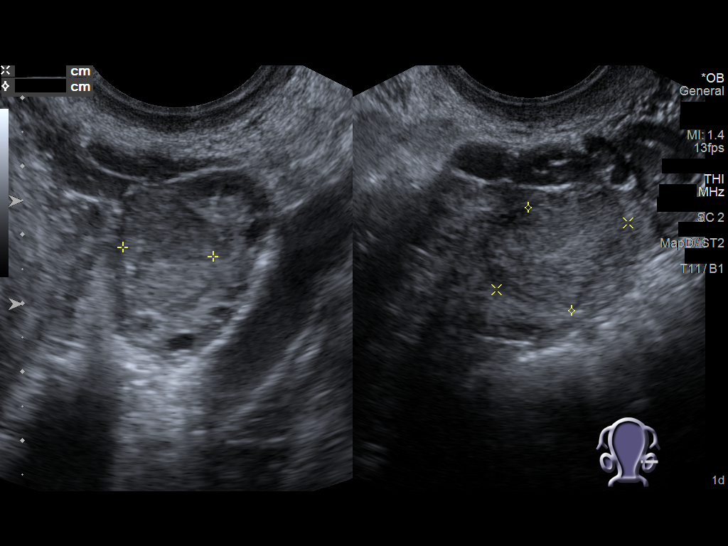

[14 of 28 positions shown; findings below may reference images not displayed]

FINDINGS: Intrauterine gestational sac: Present

Yolk sac:  Present

Embryo:  Present

Cardiac Activity: Present

Heart Rate: 110 bpm

MSD:   mm    w     d

CRL:  6.5 mm   6 w   3 d                  US EDC: 02/08/2020

Subchorionic hemorrhage:  None visualized.

Maternal uterus/adnexae: Normal ovaries.

Small amount of free pelvic fluid.
IMPRESSION: 1. Single living intrauterine embryo estimated at 6 weeks and 3 days
gestation.
2. Normal ovaries.
3. Small amount of free pelvic fluid.

## 2021-03-28 ENCOUNTER — Encounter: Payer: Self-pay | Admitting: Emergency Medicine

## 2021-03-28 ENCOUNTER — Emergency Department
Admission: EM | Admit: 2021-03-28 | Discharge: 2021-03-28 | Disposition: A | Discharge status: Home / Self Care | Payer: Medicaid Other | Attending: Emergency Medicine | Admitting: Emergency Medicine

## 2021-03-28 ENCOUNTER — Other Ambulatory Visit: Payer: Self-pay

## 2021-03-28 ENCOUNTER — Telehealth: Payer: Self-pay | Admitting: Obstetrics and Gynecology

## 2021-03-28 DIAGNOSIS — Z3A01 Less than 8 weeks gestation of pregnancy: Secondary | ICD-10-CM | POA: Insufficient documentation

## 2021-03-28 DIAGNOSIS — O200 Threatened abortion: Secondary | ICD-10-CM | POA: Insufficient documentation

## 2021-03-28 DIAGNOSIS — N9489 Other specified conditions associated with female genital organs and menstrual cycle: Secondary | ICD-10-CM | POA: Diagnosis not present

## 2021-03-28 DIAGNOSIS — O4690 Antepartum hemorrhage, unspecified, unspecified trimester: Secondary | ICD-10-CM

## 2021-03-28 DIAGNOSIS — N938 Other specified abnormal uterine and vaginal bleeding: Secondary | ICD-10-CM | POA: Diagnosis not present

## 2021-03-28 DIAGNOSIS — O209 Hemorrhage in early pregnancy, unspecified: Secondary | ICD-10-CM | POA: Diagnosis not present

## 2021-03-28 LAB — COMPREHENSIVE METABOLIC PANEL
ALT: 36 U/L (ref 0–44)
AST: 21 U/L (ref 15–41)
Albumin: 4 g/dL (ref 3.5–5.0)
Alkaline Phosphatase: 58 U/L (ref 38–126)
Anion gap: 6 (ref 5–15)
BUN: 8 mg/dL (ref 6–20)
CO2: 27 mmol/L (ref 22–32)
Calcium: 9.2 mg/dL (ref 8.9–10.3)
Chloride: 102 mmol/L (ref 98–111)
Creatinine, Ser: 0.42 mg/dL — ABNORMAL LOW (ref 0.44–1.00)
GFR, Estimated: >60 mL/min (ref >60)
Glucose, Bld: 97 mg/dL (ref 70–99)
Potassium: 3.6 mmol/L (ref 3.5–5.1)
Sodium: 135 mmol/L (ref 135–145)
Total Bilirubin: 1.1 mg/dL (ref 0.3–1.2)
Total Protein: 7.4 g/dL (ref 6.5–8.1)

## 2021-03-28 LAB — SAMPLE TO BLOOD BANK

## 2021-03-28 LAB — CBC WITH DIFFERENTIAL/PLATELET
Abs Immature Granulocytes: 0.02 10*3/uL (ref 0.00–0.07)
Basophils Absolute: 0 10*3/uL (ref 0.0–0.1)
Basophils Relative: 0 %
Eosinophils Absolute: 0 10*3/uL (ref 0.0–0.5)
Eosinophils Relative: 0 %
HCT: 40.4 % (ref 36.0–46.0)
Hemoglobin: 13.2 g/dL (ref 12.0–15.0)
Immature Granulocytes: 0 %
Lymphocytes Relative: 38 %
Lymphs Abs: 2.3 10*3/uL (ref 0.7–4.0)
MCH: 27.7 pg (ref 26.0–34.0)
MCHC: 32.7 g/dL (ref 30.0–36.0)
MCV: 84.9 fL (ref 80.0–100.0)
Monocytes Absolute: 0.4 10*3/uL (ref 0.1–1.0)
Monocytes Relative: 6 %
Neutro Abs: 3.3 10*3/uL (ref 1.7–7.7)
Neutrophils Relative %: 56 %
Platelets: 297 10*3/uL (ref 150–400)
RBC: 4.76 MIL/uL (ref 3.87–5.11)
RDW: 13.3 % (ref 11.5–15.5)
WBC: 6 10*3/uL (ref 4.0–10.5)
nRBC: 0 % (ref 0.0–0.2)

## 2021-03-28 LAB — HCG, QUANTITATIVE, PREGNANCY: hCG, Beta Chain, Quant, S: 243 m[IU]/mL — ABNORMAL HIGH (ref <5)

## 2021-03-28 LAB — POC URINE PREG, ED: Preg Test, Ur: NEGATIVE

## 2021-03-28 LAB — ABO/RH: ABO/RH(D): B POS

## 2021-03-28 NOTE — ED Notes (Signed)
First encounter with pt to D/C.  D/C and need for repeat quant discussed with pt. Reasons to return to ED discussed with pt and pt verbalized understanding. NAD noted on D/C. Pt ambulatory with steady gait on D/C.

## 2021-03-28 NOTE — Telephone Encounter (Signed)
Pt called stating that she had bleeding and went to ER- they released her with direction to follow up within 48 hours to get labs- beta - I have scheduled pt for 12-21 @ 1:45, if there could be an order placed if provider approves. Please Advise.

## 2021-03-28 NOTE — ED Provider Notes (Signed)
[facility] Emergency Department Provider Note ____________________________________________  Time seen: 1231  I have reviewed the triage vital signs and the nursing notes.  HISTORY  Chief Complaint  Vaginal Bleeding   HPI Diana Kirby is a 30 y.o. G4P3 female presents to the ED for evaluation of onset of bright red vaginal bleeding this morning.  Patient reports a positive home pregnancy test last week.  She reports she is 2 weeks late for her expected menses. Her LMP was 02/17/21. She denies any pelvic pain, N/V, or vaginal discharge. She also gives remote history of an incomplete miscarriage in June.  Past Medical History:  Diagnosis Date   Anxiety    No pertinent past medical history     Patient Active Problem List   Diagnosis Date Noted   Missed abortion    Supervision of other normal pregnancy, antepartum 08/05/2020   Anxiety and depression 10/21/2019    Past Surgical History:  Procedure Laterality Date   DILATION AND EVACUATION N/A 09/06/2020   Procedure: DILATATION AND EVACUATION;  Surgeon: Natale Milch, MD;  Location: [facility] ORS;  Service: Gynecology;  Laterality: N/A;   NO PAST SURGERIES     no surgical history      Prior to Admission medications   Medication Sig Start Date End Date Taking? Authorizing Provider  ibuprofen (ADVIL) 600 MG tablet Take 1 tablet (600 mg total) by mouth every 6 (six) hours as needed. 09/06/20   Schuman, Jaquelyn Bitter, MD  traMADol (ULTRAM) 50 MG tablet Take 1 tablet (50 mg total) by mouth every 6 (six) hours as needed. 09/06/20 09/06/21  Natale Milch, MD    Allergies Penicillins  Family History  Problem Relation Age of Onset   Healthy Mother    Healthy Father    Lupus Paternal Aunt    Diabetes Paternal Aunt    Fibroids Paternal Aunt    Healthy Maternal Grandmother    Dementia Maternal Grandfather    Healthy Paternal Grandmother     Social History Social History   Tobacco Use   Smoking  status: Never   Smokeless tobacco: Never  Vaping Use   Vaping Use: Never used  Substance Use Topics   Alcohol use: Not Currently   Drug use: Never    Review of Systems  Constitutional: Negative for fever. Eyes: Negative for visual changes. ENT: Negative for sore throat. Cardiovascular: Negative for chest pain. Respiratory: Negative for shortness of breath. Gastrointestinal: Negative for abdominal pain, vomiting and diarrhea. Genitourinary: Negative for dysuria. Vaginal bleeding Musculoskeletal: Negative for back pain. Skin: Negative for rash. Neurological: Negative for headaches, focal weakness or numbness. ____________________________________________  PHYSICAL EXAM:  VITAL SIGNS: ED Triage Vitals  Enc Vitals Group     BP 03/28/21 1134 125/82     Pulse Rate 03/28/21 1134 72     Resp 03/28/21 1134 16     Temp 03/28/21 1134 98.8 F (37.1 C)     Temp Source 03/28/21 1134 Oral     SpO2 03/28/21 1134 100 %     Weight 03/28/21 1202 195 lb 1.7 oz (88.5 kg)     Height 03/28/21 1202 5\' 3"  (1.6 m)     Head Circumference --      Peak Flow --      Pain Score 03/28/21 1142 0     Pain Loc --      Pain Edu? --      Excl. in GC? --     Constitutional: Alert and oriented. Well  appearing and in no distress. Head: Normocephalic and atraumatic. Eyes: Conjunctivae are normal. Normal extraocular movements Cardiovascular: Normal rate, regular rhythm. Normal distal pulses. Respiratory: Normal respiratory effort. No wheezes/rales/rhonchi. Gastrointestinal: Soft and nontender. No distention. GU: declined by patient.  Musculoskeletal: Nontender with normal range of motion in all extremities.  Neurologic:  Normal gait without ataxia. Normal speech and language. No gross focal neurologic deficits are appreciated. Skin:  Skin is warm, dry and intact. No rash noted. Psychiatric: Mood and affect are normal. Patient exhibits appropriate insight and  judgment. ____________________________________________    {LABS (pertinent positives/negatives)  Labs Reviewed  COMPREHENSIVE METABOLIC PANEL - Abnormal; Notable for the following components:      Result Value   Creatinine, Ser 0.42 (*)    All other components within normal limits  HCG, QUANTITATIVE, PREGNANCY - Abnormal; Notable for the following components:   hCG, Beta Chain, Quant, S 243 (*)    All other components within normal limits  CBC WITH DIFFERENTIAL/PLATELET  POC URINE PREG, ED  ABO/RH  SAMPLE TO BLOOD BANK  ____________________________________________  {EKG  ____________________________________________   RADIOLOGY Official radiology report(s): No results found. ____________________________________________  PROCEDURES   Procedures ____________________________________________   INITIAL IMPRESSION / ASSESSMENT AND PLAN / ED COURSE  As part of my medical decision making, I reviewed the following data within the electronic MEDICAL RECORD NUMBER Labs reviewed as noted and Notes from prior ED visits   Differential diagnosis includes, but is not limited to, threatened miscarriage, incomplete miscarriage, normal bleeding from an early trimester pregnancy, ectopic pregnancy, blighted ovum, vaginal/cervical trauma, subchorionic hemorrhage/hematoma, etc.  Female patient with ED evaluation pain with vaginal bleeding with onset this morning.  Patient is approximately 5 weeks by LMP reporting a positive home pregnancy test last week.  Her initial urine pregnancy test here was negative, however, she was found today to have a beta quant of 243.  Patient continued to experience active bleeding, but declined a pelvic exam at this time.  She is scheduled to see her planned OB provider next week for initial intake.  I suggested the patient return to this ED in 2 days for serial beta quant, or she may see her OB provider for the same.  I further emphasized that vaginal bleeding in first  trimester may have no bearing on the viability of the pregnancy.  She verbalized understanding of discharge instructions.  Clinical Course as of 03/28/21 1554  Tue Mar 28, 2021  1445 HCG, Beta Chain, Quant, S(!): 243 [JM]  1446 hCG, quantitative, pregnancy(!) [JM]    Clinical Course User Index [JM] Karmen Stabs, Charlesetta Ivory, PA-C    Diana Kirby was evaluated in Emergency Department on 03/28/2021 for the symptoms described in the history of present illness. She was evaluated in the context of the global COVID-19 pandemic, which necessitated consideration that the patient might be at risk for infection with the SARS-CoV-2 virus that causes COVID-19. Institutional protocols and algorithms that pertain to the evaluation of patients at risk for COVID-19 are in a state of rapid change based on information released by regulatory bodies including the CDC and federal and state organizations. These policies and algorithms were followed during the patient's care in the ED. ____________________________________________  FINAL CLINICAL IMPRESSION(S) / ED DIAGNOSES  Final diagnoses:  Vaginal bleeding in pregnancy  Threatened miscarriage      Lissa Hoard, PA-C 03/28/21 1554    Chesley Noon, MD 03/28/21 1816

## 2021-03-28 NOTE — Discharge Instructions (Addendum)
Your pregnancy hormone level is 243 mg/dl.  Vaginal bleeding in the first trimester can occur, and has no bearing on the ongoing viability of the pregnancy. You should return to this ED or your OB provider, for repeat beta quant blood draw in 48 hours.  You have vaginal bleeding may continue, with the serial lab testing of your hormone levels will determine the progression of the pregnancy.  Take OTC Tylenol as needed for headache pain relief.

## 2021-03-28 NOTE — ED Notes (Signed)
Attempted for 22g IV at R hand; pt already stuck twice while in triage for attempts for blood; pt reports history of hard stick; 2nd RN to try soon; will order phlebotomy/IV team help if needed.

## 2021-03-28 NOTE — ED Triage Notes (Signed)
Pt reports that she took a pregnancy test last week and was positive, she had a miscarriage in June, she is having some bright red bleeding that began today. No pain at this time.

## 2021-03-28 NOTE — ED Notes (Signed)
Lab called for blood collect 

## 2021-03-29 ENCOUNTER — Other Ambulatory Visit: Payer: Medicaid Other

## 2021-03-29 DIAGNOSIS — O209 Hemorrhage in early pregnancy, unspecified: Secondary | ICD-10-CM

## 2021-04-01 IMAGING — US US ABDOMEN LIMITED
1 series · 14 of 25 positions shown · non-contrast
Comparison: None.

CLINICAL DATA: Right upper quadrant pain.

EXAM:
ULTRASOUND ABDOMEN LIMITED RIGHT UPPER QUADRANT

[Series 1: us abdomen limited ruq · 14 of 39 slices shown]
[im 1/39]
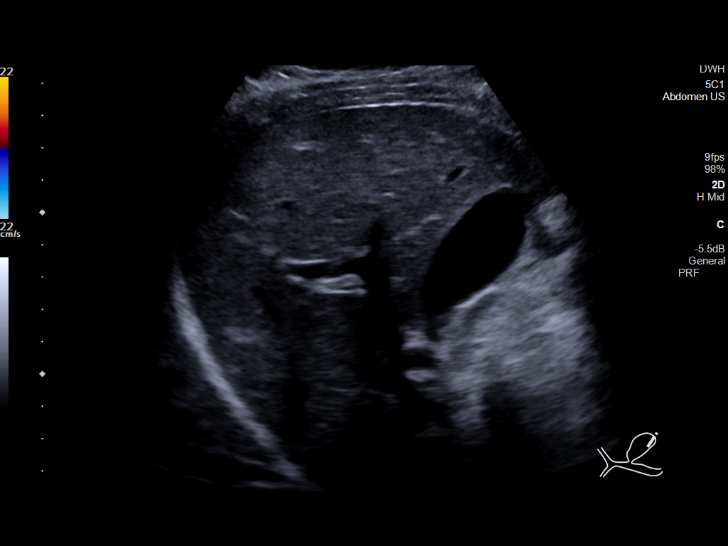
[im 4/39]
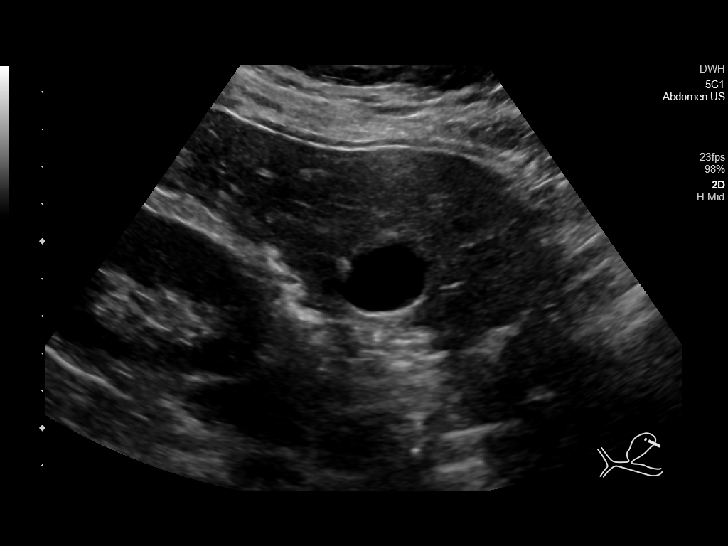
[im 7/39]
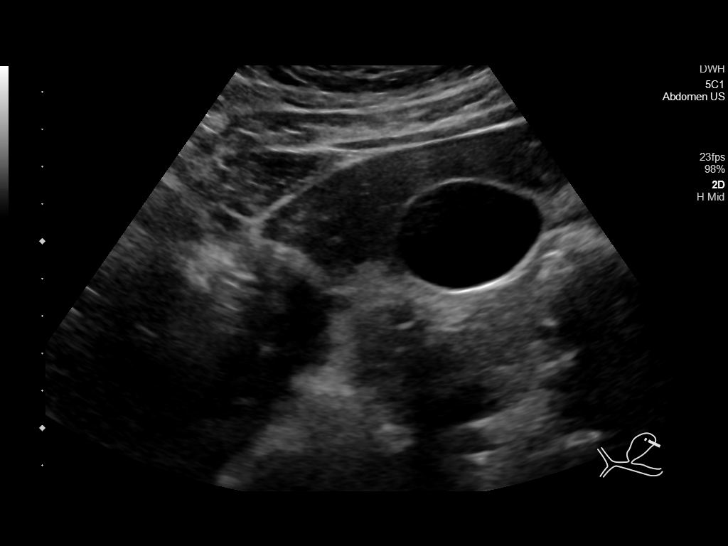
[im 10/39]
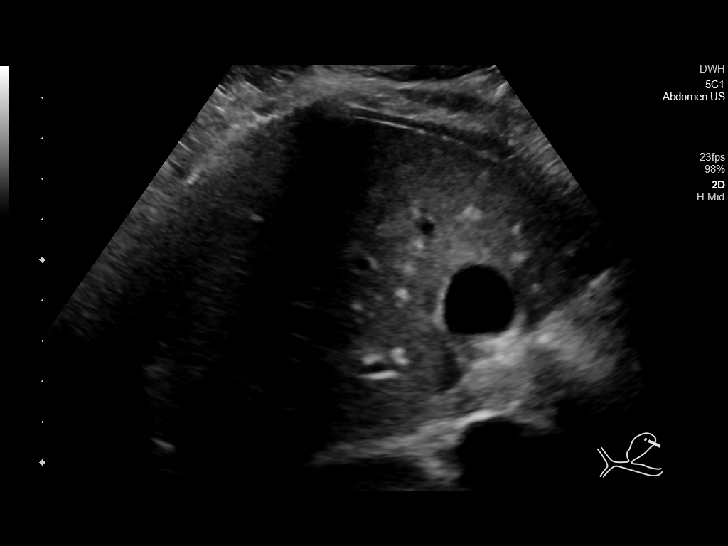
[im 13/39]
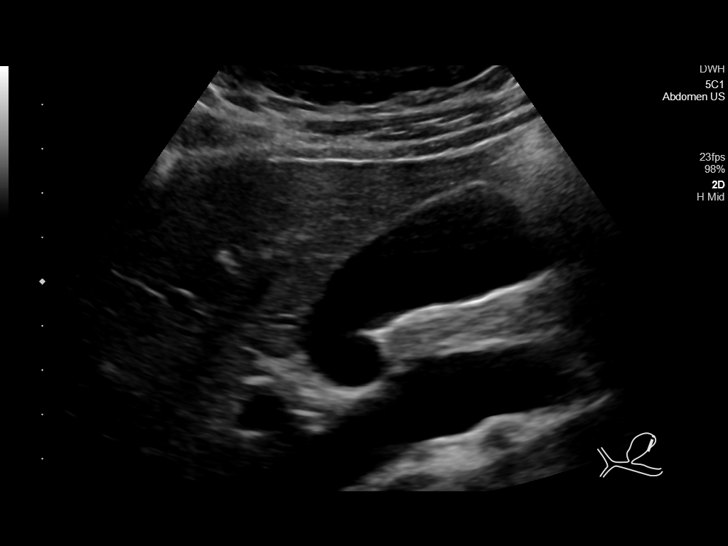
[im 15/39]
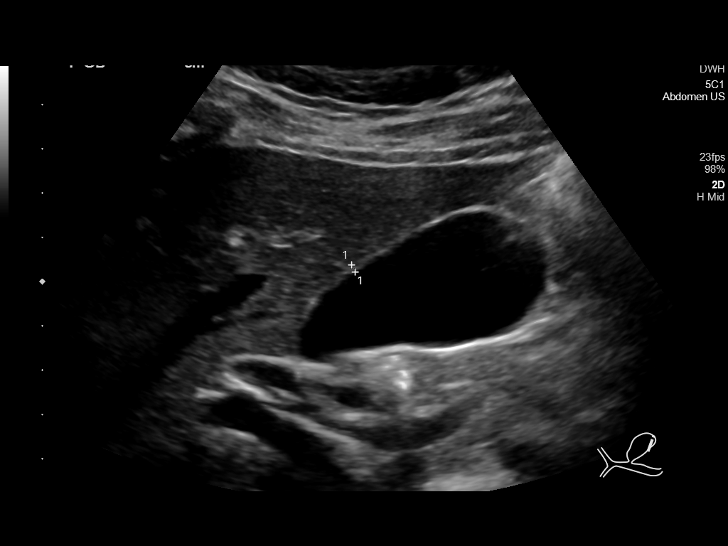
[im 18/39]
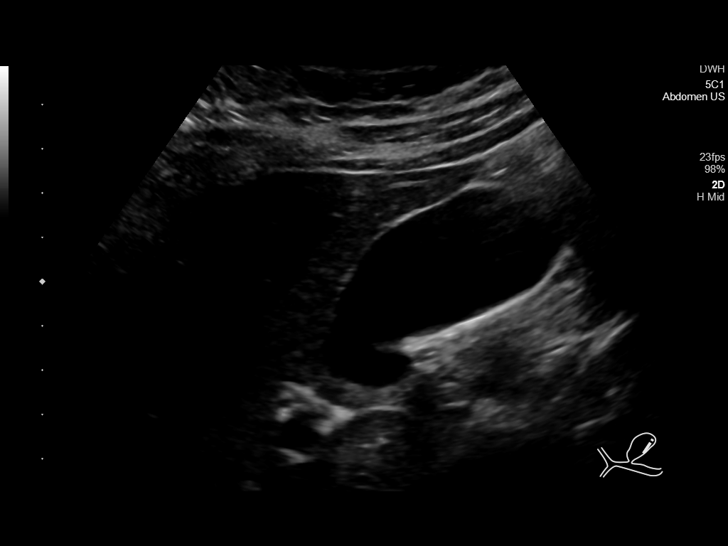
[im 21/39]
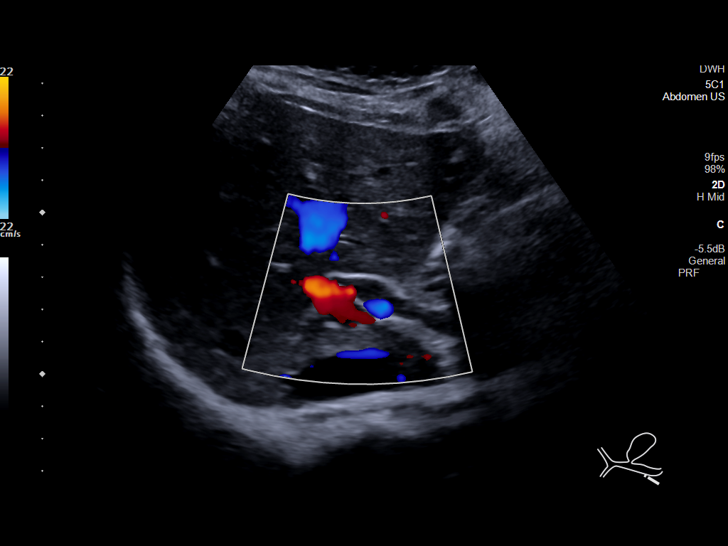
[im 24/39]
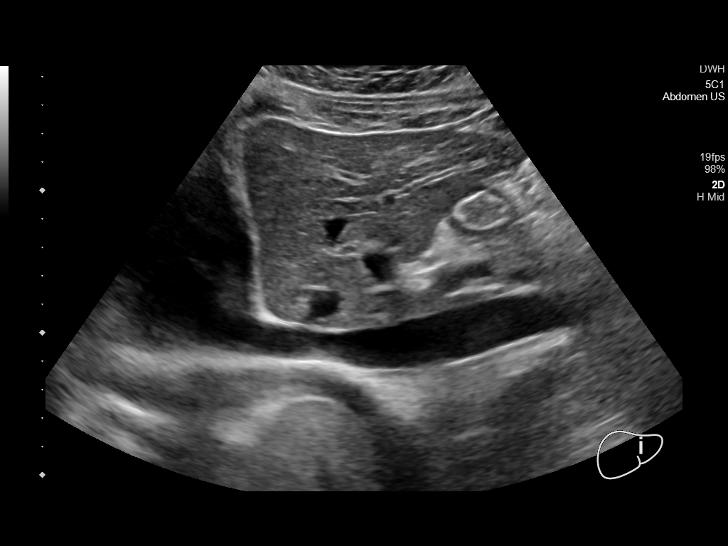
[im 26/39]
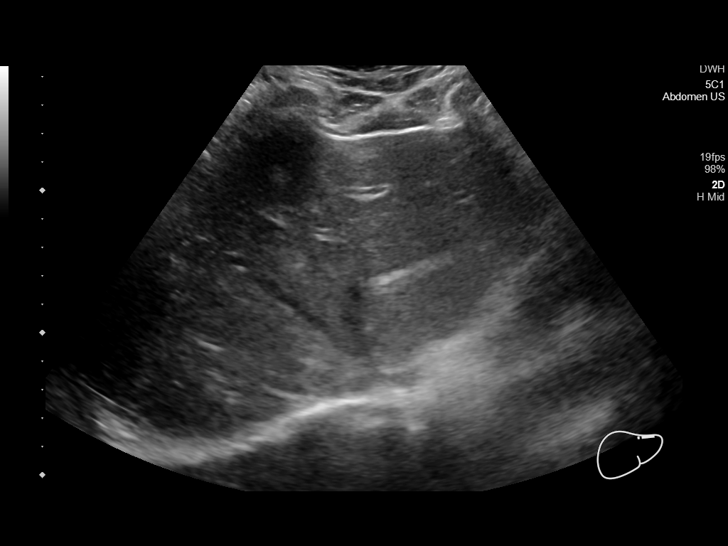
[im 29/39]
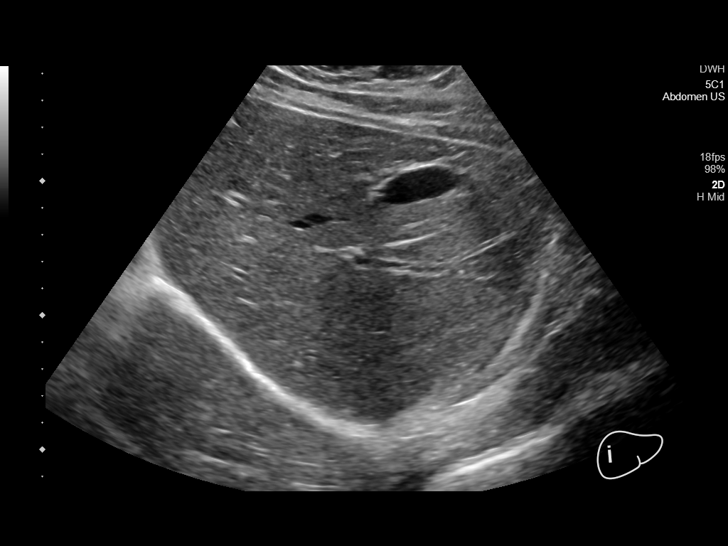
[im 32/39]
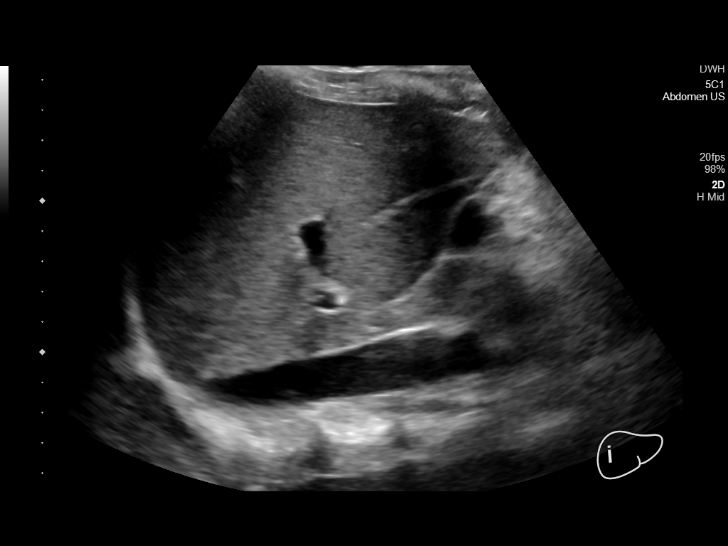
[im 35/39]
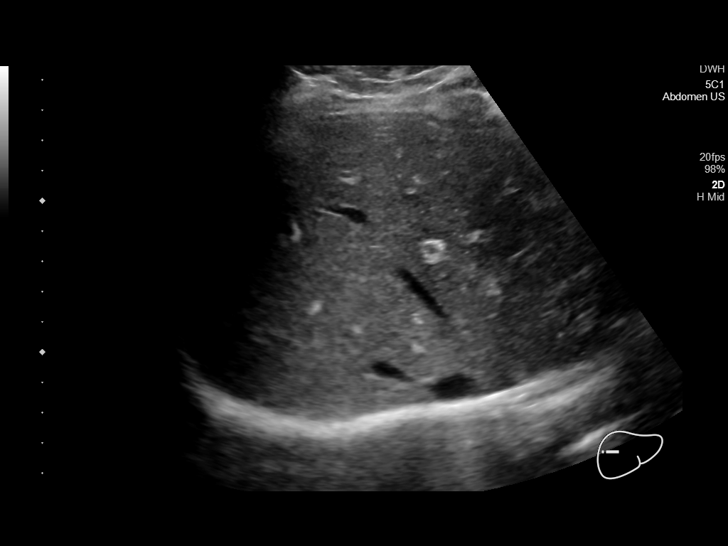
[im 39/39]
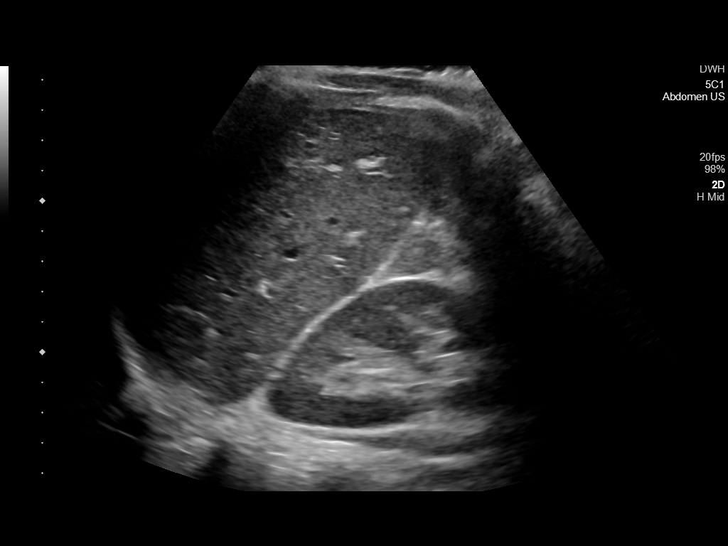

[14 of 25 positions shown; findings below may reference images not displayed]

FINDINGS: Gallbladder:

No gallstones or wall thickening visualized (1.8 mm). No sonographic
Murphy sign noted by sonographer.

Common bile duct:

Diameter: 5.2 mm

Liver:

No focal lesion identified. Within normal limits in parenchymal
echogenicity. Portal vein is patent on color Doppler imaging with
normal direction of blood flow towards the liver.

Other: None.
IMPRESSION: Normal right upper quadrant ultrasound.

## 2021-04-04 ENCOUNTER — Other Ambulatory Visit: Payer: Self-pay

## 2021-04-04 ENCOUNTER — Encounter: Payer: Self-pay | Admitting: Obstetrics and Gynecology

## 2021-04-04 ENCOUNTER — Ambulatory Visit (INDEPENDENT_AMBULATORY_CARE_PROVIDER_SITE_OTHER): Payer: Medicaid Other | Admitting: Obstetrics and Gynecology

## 2021-04-04 VITALS — BP 127/92 | HR 76 | Resp 16 | Ht 63.0 in | Wt 207.2 lb

## 2021-04-04 DIAGNOSIS — O209 Hemorrhage in early pregnancy, unspecified: Secondary | ICD-10-CM | POA: Diagnosis not present

## 2021-04-04 DIAGNOSIS — Z3200 Encounter for pregnancy test, result unknown: Secondary | ICD-10-CM | POA: Diagnosis not present

## 2021-04-04 LAB — POCT URINE PREGNANCY: Preg Test, Ur: NEGATIVE

## 2021-04-04 NOTE — Progress Notes (Signed)
HPI:      Diana Kirby is a 30 y.o. [phone] who LMP was Patient's last menstrual period was 06/10/2020 (exact date).  Subjective:   She presents today after being seen in the emergency department 1 week ago.  She was having very heavy bleeding and cramping at that time.  Quantitative beta-hCG was 243.  She did not have an ultrasound.  She reports that her bleeding has significantly decreased since then.    Hx: The following portions of the patient's history were reviewed and updated as appropriate:             She  has a past medical history of Anxiety and No pertinent past medical history. She does not have any pertinent problems on file. She  has a past surgical history that includes no surgical history; No past surgeries; and Dilation and evacuation (N/A, 09/06/2020). Her family history includes Dementia in her maternal grandfather; Diabetes in her paternal aunt; Fibroids in her paternal aunt; Healthy in her father, maternal grandmother, mother, and paternal grandmother; Lupus in her paternal aunt. She  reports that she has never smoked. She has never used smokeless tobacco. She reports that she does not currently use alcohol. She reports that she does not use drugs. She currently has no medications in their medication list. She is allergic to penicillins.       Review of Systems:  Review of Systems  Constitutional: Denied constitutional symptoms, night sweats, recent illness, fatigue, fever, insomnia and weight loss.  Eyes: Denied eye symptoms, eye pain, photophobia, vision change and visual disturbance.  Ears/Nose/Throat/Neck: Denied ear, nose, throat or neck symptoms, hearing loss, nasal discharge, sinus congestion and sore throat.  Cardiovascular: Denied cardiovascular symptoms, arrhythmia, chest pain/pressure, edema, exercise intolerance, orthopnea and palpitations.  Respiratory: Denied pulmonary symptoms, asthma, pleuritic pain, productive sputum, cough, dyspnea and wheezing.   Gastrointestinal: Denied, gastro-esophageal reflux, melena, nausea and vomiting.  Genitourinary: Denied genitourinary symptoms including symptomatic vaginal discharge, pelvic relaxation issues, and urinary complaints.  Musculoskeletal: Denied musculoskeletal symptoms, stiffness, swelling, muscle weakness and myalgia.  Dermatologic: Denied dermatology symptoms, rash and scar.  Neurologic: Denied neurology symptoms, dizziness, headache, neck pain and syncope.  Psychiatric: Denied psychiatric symptoms, anxiety and depression.  Endocrine: Denied endocrine symptoms including hot flashes and night sweats.   Meds:   No current outpatient medications on file prior to visit.   No current facility-administered medications on file prior to visit.      Objective:     Vitals:   04/04/21 1114  BP: (!) 127/92  Pulse: 76  Resp: 16   Filed Weights   04/04/21 1114  Weight: 207 lb 3.2 oz (94 kg)              Urinary beta-hCG negative          Assessment:    [id number] Patient Active Problem List   Diagnosis Date Noted   Missed abortion    Supervision of other normal pregnancy, antepartum 08/05/2020   Anxiety and depression 10/21/2019     1. Possible pregnancy, not yet confirmed   2. Bleeding in early pregnancy     Negative hCG may imply patient had a completed miscarriage.   Plan:            1.  Quantitative beta-hCG today.  We will consider ultrasound versus expectant management based on beta-hCG results.  I have discussed results in detail including possibility of miscarriage, ectopic, and intrauterine pregnancy.  Orders Orders Placed This Encounter  Procedures  Beta hCG quant (ref lab)   POCT urine pregnancy    No orders of the defined types were placed in this encounter.     F/U  Return in about 4 weeks (around 05/02/2021). I spent 23 minutes involved in the care of this patient preparing to see the patient by obtaining and reviewing her medical history (including  labs, imaging tests and prior procedures), documenting clinical information in the electronic health record (EHR), counseling and coordinating care plans, writing and sending prescriptions, ordering tests or procedures and in direct communicating with the patient and medical staff discussing pertinent items from her history and physical exam.  Elonda Husky, M.D. 04/04/2021 11:35 AM

## 2021-04-05 ENCOUNTER — Telehealth: Payer: Self-pay | Admitting: Obstetrics and Gynecology

## 2021-04-05 LAB — BETA HCG QUANT (REF LAB): hCG Quant: 6 m[IU]/mL

## 2021-04-05 NOTE — Progress Notes (Signed)
Diana Kirby: Your hormone level test is now 6.  This is down from 270.  As we discussed in the office unfortunately this is a sign that you have had a miscarriage.  Based on this you should not have any further significant passage of tissue or bleeding.

## 2021-04-05 NOTE — Telephone Encounter (Signed)
Pt is calling in to get her lab results from 04/04/2021.  Pt is aware that someone from our office will call her once the provider has resulted them.

## 2021-04-06 NOTE — Telephone Encounter (Signed)
Pt is calling in to check the status of her labs due to her not being able to get into he myChart.  Pt is aware that someone from the office will give her a call once it has been resulted.

## 2021-04-09 NOTE — L&D Delivery Note (Signed)
     Delivery Note   Shaune Malacara is a 31 y.o. [id number] at [redacted]w[redacted]d Estimated Date of Delivery: 02/02/22  PRE-OPERATIVE DIAGNOSIS:  1) [redacted]w[redacted]d pregnancy.    POST-OPERATIVE DIAGNOSIS:  1) [redacted]w[redacted]d pregnancy s/p Vaginal, Spontaneous    Delivery Type: Vaginal, Spontaneous    Delivery Anesthesia: Epidural   Labor Complications:  none    ESTIMATED BLOOD LOSS: 150  ml    FINDINGS:   1) female infant, Apgar scores of 9   at 1 minute and 9   at 5 minutes and a birthweight of   ounces.    2) Nuchal cord: no  SPECIMENS:   PLACENTA:   Appearance: Intact , 3 vessel cord   Removal: Spontaneous      Disposition:  per protocol   DISPOSITION:  Infant to left in stable condition in the delivery room, with L&D personnel and mother,  NARRATIVE SUMMARY: Labor course:  Ms. Stavroula Rohde is a [id number] at [redacted]w[redacted]d who presented for labor management.  She progressed well in labor with pitocin.  She received the appropriate epidural anesthesia and proceeded to complete dilation. She evidenced good maternal expulsive effort during the second stage. She went on to deliver a viable female  infant. The placenta delivered without problems and was noted to be complete. A perineal and vaginal examination was performed. Episiotomy/Lacerations: None , periurethral and perineal abrasions, hemostatic not in need of repair. The patient tolerated this well.  Philip Aspen, CNM  01/28/2022 3:40 PM

## 2021-04-19 NOTE — Telephone Encounter (Signed)
Patient called.  Patient aware.  

## 2021-06-21 ENCOUNTER — Other Ambulatory Visit: Payer: Self-pay

## 2021-06-21 ENCOUNTER — Ambulatory Visit (INDEPENDENT_AMBULATORY_CARE_PROVIDER_SITE_OTHER): Payer: Medicaid Other | Admitting: Obstetrics

## 2021-06-21 ENCOUNTER — Encounter: Payer: Self-pay | Admitting: Obstetrics

## 2021-06-21 VITALS — BP 113/72 | Ht 63.0 in | Wt 199.0 lb

## 2021-06-21 DIAGNOSIS — Z3200 Encounter for pregnancy test, result unknown: Secondary | ICD-10-CM

## 2021-06-21 MED ORDER — PRENATAL PLUS VITAMIN/MINERAL 27-1 MG PO TABS: 1.0000 | ORAL_TABLET | Freq: Every day | ORAL | 4 refills | Status: DC

## 2021-06-21 MED ORDER — ONDANSETRON 4 MG PO TBDP
4.0000 mg | ORAL_TABLET | Freq: Three times a day (TID) | ORAL | 0 refills | Status: DC | PRN
Start: 2021-06-21 — End: 2022-01-17

## 2021-06-21 NOTE — Progress Notes (Signed)
SUBJECTIVE ? ?James Lafalce is a 31 y.o. female who presents for evaluation of amenorrhea and possible pregnancy. Her LMP was Patient's last menstrual period was 04/28/2021 (exact date). and was Normal. She had a miscarriage in December and reports that she had stopped bleeding from that and then had a normal period in January. She feel anxious about this pregnancy because she has recently lost two pregnancies. ?Pregnancy is desired. ?Current symptoms include fatigue, nausea, and positive home pregnancy test ? ?Review of Systems ?Pertinent items are noted in HPI. ? ?OBJECTIVE ?BP 113/72   Ht 5\' 3"  (1.6 m)   Wt 199 lb (90.3 kg)   LMP 04/28/2021 (Exact Date)   BMI 35.25 kg/m?  ?Body mass index is 35.25 kg/m?. ? ?Alert, well appearing, and in no distress and oriented to person, place, and time. Feeling anxious. ? ?Lab Review ?Urine hCG: Positive ? ?ASSESSMENT ?Amenorrhea. Presumed pregnancy at [redacted]w[redacted]d with EDD of 02/02/22. ? ?PLAN  ?Encouraged a well-balanced diet, rest, hydration, prenatal vitamins, and walking for exercise. Counseled to avoid alcohol, tobacco, and recreational drugs and to minimize caffeine intake. Safe medications list given. Discussed non-pharmacologic relief measures for nausea. Rx for Zofran and PNV sent per pt request. Reviewed options of midwifery or physician care for prenatal care and birth; she plans to see midwives for this pregnancy Genetic screening options were discussed.  A dating 02/04/22 was offered and ordered. Return in 2-3 weeks for a nurse visit for labs and prenatal education, and 4-5 weeks for a NOB physical and genetic screening if desired.  ? ?[place], CNM  ?

## 2021-06-22 ENCOUNTER — Encounter: Payer: Self-pay | Admitting: Obstetrics

## 2021-06-22 LAB — HUMAN CHORIONIC GONADOTROPIN(HCG),B-SUBUNIT,QUANTITATIVE): HCG, Beta Chain, Quant, S: 94877 m[IU]/mL

## 2021-06-30 ENCOUNTER — Other Ambulatory Visit: Payer: Self-pay

## 2021-06-30 ENCOUNTER — Ambulatory Visit (INDEPENDENT_AMBULATORY_CARE_PROVIDER_SITE_OTHER): Payer: Medicaid Other

## 2021-06-30 DIAGNOSIS — Z3A09 9 weeks gestation of pregnancy: Secondary | ICD-10-CM

## 2021-06-30 DIAGNOSIS — Z3200 Encounter for pregnancy test, result unknown: Secondary | ICD-10-CM

## 2021-06-30 DIAGNOSIS — Z3481 Encounter for supervision of other normal pregnancy, first trimester: Secondary | ICD-10-CM

## 2021-07-03 ENCOUNTER — Other Ambulatory Visit: Payer: Self-pay

## 2021-07-03 ENCOUNTER — Ambulatory Visit (INDEPENDENT_AMBULATORY_CARE_PROVIDER_SITE_OTHER): Payer: Medicaid Other | Admitting: Obstetrics

## 2021-07-03 VITALS — BP 106/64 | HR 82 | Resp 16 | Ht 63.0 in | Wt 173.1 lb

## 2021-07-03 DIAGNOSIS — O99210 Obesity complicating pregnancy, unspecified trimester: Secondary | ICD-10-CM | POA: Diagnosis not present

## 2021-07-03 DIAGNOSIS — Z3A09 9 weeks gestation of pregnancy: Secondary | ICD-10-CM

## 2021-07-03 DIAGNOSIS — Z113 Encounter for screening for infections with a predominantly sexual mode of transmission: Secondary | ICD-10-CM | POA: Diagnosis not present

## 2021-07-03 DIAGNOSIS — Z3481 Encounter for supervision of other normal pregnancy, first trimester: Secondary | ICD-10-CM | POA: Diagnosis not present

## 2021-07-03 NOTE — Patient Instructions (Signed)
Morning Sickness ?Morning sickness is when you feel like you may vomit (feel nauseous) during pregnancy. Sometimes, you may vomit. Morning sickness most often happens in the morning, but it can also happen at any time of the day. Some women may have morning sickness that makes them vomit all the time. This is a more serious problem that needs treatment. ?What are the causes? ?The cause of this condition is not known. ?What increases the risk? ?You had vomiting or a feeling like you may vomit before your pregnancy. ?You had morning sickness in another pregnancy. ?You are pregnant with more than one baby, such as twins. ?What are the signs or symptoms? ?Feeling like you may vomit. ?Vomiting. ?How is this treated? ?Treatment is usually not needed for this condition. You may only need to change what you eat. In some cases, your doctor may give you some things to take for your condition. These include: ?Vitamin B6 supplements. ?Medicines to treat the feeling that you may vomit. ?Ginger. ?Follow these instructions at home: ?Medicines ?Take over-the-counter and prescription medicines only as told by your doctor. Do not take any medicines until you talk with your doctor about them first. ?Take multivitamins before you get pregnant. These can stop or lessen the symptoms of morning sickness. ?Eating and drinking ?Eat dry toast or crackers before getting out of bed. ?Eat 5 or 6 small meals a day. ?Eat dry and bland foods like rice and baked potatoes. ?Do not eat greasy, fatty, or spicy foods. ?Have someone cook for you if the smell of food causes you to vomit or to feel like you may vomit. ?If you feel like you may vomit after taking prenatal vitamins, take them at night or with a snack. ?Eat protein foods when you need a snack. Nuts, yogurt, and cheese are good choices. ?Drink fluids throughout the day. ?Try ginger ale made with real ginger, ginger tea made from fresh grated ginger, or ginger candies. ?General  instructions ?Do not smoke or use any products that contain nicotine or tobacco. If you need help quitting, ask your doctor. ?Use an air purifier to keep the air in your house free of smells. ?Get lots of fresh air. ?Try to avoid smells that make you feel sick. ?Try wearing an acupressure wristband. This is a wristband that is used to treat seasickness. ?Try a treatment called acupuncture. In this treatment, a doctor puts needles into certain areas of your body to make you feel better. ?Contact a doctor if: ?You need medicine to feel better. ?You feel dizzy or light-headed. ?You are losing weight. ?Get help right away if: ?The feeling that you may vomit will not go away, or you cannot stop vomiting. ?You faint. ?You have very bad pain in your belly. ?Summary ?Morning sickness is when you feel like you may vomit (feel nauseous) during pregnancy. ?You may feel sick in the morning, but you can feel this way at any time of the day. ?Making some changes to what you eat may help your symptoms go away. ?This information is not intended to replace advice given to you by your health care provider. Make sure you discuss any questions you have with your health care provider. ?Document Revised: 11/09/2019 Document Reviewed: 10/19/2019 ?Elsevier Patient Education ? 2022 [employer] ?First Trimester of Pregnancy ?The first trimester of pregnancy starts on the first day of your last menstrual period until the end of week 12. This is also called months 1 through 3 of pregnancy. ?Body changes during your  first trimester ?Your body goes through many changes during pregnancy. The changes usually return to normal after your baby is born. ?Physical changes ?You may gain or lose weight. ?Your breasts may grow larger and hurt. The area around your nipples may get darker. ?Dark spots or blotches may develop on your face. ?You may have changes in your hair. ?Health changes ?You may feel like you might vomit (nauseous), and you may  vomit. ?You may have heartburn. ?You may have headaches. ?You may have trouble pooping (constipation). ?Your gums may bleed. ?Other changes ?You may get tired easily. ?You may pee (urinate) more often. ?Your menstrual periods will stop. ?You may not feel hungry. ?You may want to eat certain kinds of food. ?You may have changes in your emotions from day to day. ?You may have more dreams. ?Follow these instructions at home: ?Medicines ?Take over-the-counter and prescription medicines only as told by your doctor. Some medicines are not safe during pregnancy. ?Take a prenatal vitamin that contains at least 600 micrograms (mcg) of folic acid. ?Eating and drinking ?Eat healthy meals that include: ?Fresh fruits and vegetables. ?Whole grains. ?Good sources of protein, such as meat, eggs, or tofu. ?Low-fat dairy products. ?Avoid raw meat and unpasteurized juice, milk, and cheese. ?If you feel like you may vomit, or you vomit: ?Eat 4 or 5 small meals a day instead of 3 large meals. ?Try eating a few soda crackers. ?Drink liquids between meals instead of during meals. ?You may need to take these actions to prevent or treat trouble pooping: ?Drink enough fluids to keep your pee (urine) pale yellow. ?Eat foods that are high in fiber. These include beans, whole grains, and fresh fruits and vegetables. ?Limit foods that are high in fat and sugar. These include fried or sweet foods. ?Activity ?Exercise only as told by your doctor. Most people can do their usual exercise routine during pregnancy. ?Stop exercising if you have cramps or pain in your lower belly (abdomen) or low back. ?Do not exercise if it is too hot or too humid, or if you are in a place of great height (high altitude). ?Avoid heavy lifting. ?If you choose to, you may have sex unless your doctor tells you not to. ?Relieving pain and discomfort ?Wear a good support bra if your breasts are sore. ?Rest with your legs raised (elevated) if you have leg cramps or low back  pain. ?If you have bulging veins (varicose veins) in your legs: ?Wear support hose as told by your doctor. ?Raise your feet for 15 minutes, 3-4 times a day. ?Limit salt in your food. ?Safety ?Wear your seat belt at all times when you are in a car. ?Talk with your doctor if someone is hurting you or yelling at you. ?Talk with your doctor if you are feeling sad or have thoughts of hurting yourself. ?Lifestyle ?Do not use hot tubs, steam rooms, or saunas. ?Do not douche. Do not use tampons or scented sanitary pads. ?Do not use herbal medicines, illegal drugs, or medicines that are not approved by your doctor. Do not drink alcohol. ?Do not smoke or use any products that contain nicotine or tobacco. If you need help quitting, ask your doctor. ?Avoid cat litter boxes and soil that is used by cats. These carry germs that can cause harm to the baby and can cause a loss of your baby by miscarriage or stillbirth. ?General instructions ?Keep all follow-up visits. This is important. ?Ask for help if you need counseling or if you  need help with nutrition. Your doctor can give you advice or tell you where to go for help. ?Visit your dentist. At home, brush your teeth with a soft toothbrush. Floss gently. ?Write down your questions. Take them to your prenatal visits. ?Where to find more information ?American Pregnancy Association: americanpregnancy.org ?[employer]: www.acog.org ?Office on Women's Health: MightyReward.co.nz ?Contact a doctor if: ?You are dizzy. ?You have a fever. ?You have mild cramps or pressure in your lower belly. ?You have a nagging pain in your belly area. ?You continue to feel like you may vomit, you vomit, or you have watery poop (diarrhea) for 24 hours or longer. ?You have a bad-smelling fluid coming from your vagina. ?You have pain when you pee. ?You are exposed to a disease that spreads from person to person, such as chickenpox, measles, Zika virus, HIV, or  hepatitis. ?Get help right away if: ?You have spotting or bleeding from your vagina. ?You have very bad belly cramping or pain. ?You have shortness of breath or chest pain. ?You have any kind of injury, such as from a fal

## 2021-07-03 NOTE — Progress Notes (Signed)
Diana Kirby presents for NOB nurse interview visit. Pregnancy confirmation done 06/21/2021.  [id number]. Pregnancy education material explained and given. No cats in the home. NOB labs ordered. TSH/HbgA1c due to Increased BMI. Body mass index is 30.66 kg/m?Marland Kitchen Sickle cell labs ordered. HIV labs and Drug screen were explained optional and she did not decline. Drug screen ordered. PNV encouraged. Genetic screening options discussed. Genetic testing: Discussed. Pt may discuss with provider.  Financial policy not discussed, she has medicaid. FMLA form reviewed and signed. Patient to follow up with [name] in 3 weeks for NOB physical.  All questions answered. ? ? ?

## 2021-07-04 LAB — PAIN MGT SCRN (14 DRUGS), UR
Amphetamine Scrn, Ur: NEGATIVE ng/mL
BARBITURATE SCREEN URINE: NEGATIVE ng/mL
BENZODIAZEPINE SCREEN, URINE: NEGATIVE ng/mL
Buprenorphine, Urine: NEGATIVE ng/mL
CANNABINOIDS UR QL SCN: POSITIVE ng/mL — AB
Cocaine (Metab) Scrn, Ur: NEGATIVE ng/mL
Creatinine(Crt), U: 256 mg/dL (ref 20.0–300.0)
Fentanyl, Urine: NEGATIVE pg/mL
Meperidine Screen, Urine: NEGATIVE ng/mL
Methadone Screen, Urine: NEGATIVE ng/mL
OXYCODONE+OXYMORPHONE UR QL SCN: NEGATIVE ng/mL
Opiate Scrn, Ur: NEGATIVE ng/mL
Ph of Urine: 6.8 (ref 4.5–8.9)
Phencyclidine Qn, Ur: NEGATIVE ng/mL
Propoxyphene Scrn, Ur: NEGATIVE ng/mL
Tramadol Screen, Urine: NEGATIVE ng/mL

## 2021-07-04 LAB — NICOTINE SCREEN, URINE: Cotinine Ql Scrn, Ur: NEGATIVE ng/mL

## 2021-07-04 LAB — VIRAL HEPATITIS HBV, HCV
HCV Ab: NONREACTIVE
Hep B Core Total Ab: NEGATIVE
Hep B Surface Ab, Qual: REACTIVE
Hepatitis B Surface Ag: NEGATIVE

## 2021-07-04 LAB — URINALYSIS, ROUTINE W REFLEX MICROSCOPIC
Bilirubin, UA: NEGATIVE
Glucose, UA: NEGATIVE
Leukocytes,UA: NEGATIVE
Nitrite, UA: NEGATIVE
RBC, UA: NEGATIVE
Specific Gravity, UA: >=1.03 — AB (ref 1.005–1.030)
Urobilinogen, Ur: 1 mg/dL (ref 0.2–1.0)
pH, UA: 7 (ref 5.0–7.5)

## 2021-07-04 LAB — ABO AND RH: Rh Factor: POSITIVE

## 2021-07-04 LAB — RUBELLA SCREEN: Rubella Antibodies, IGG: 1.41 index (ref >0.99)

## 2021-07-04 LAB — HIV ANTIBODY (ROUTINE TESTING W REFLEX): HIV Screen 4th Generation wRfx: NONREACTIVE

## 2021-07-04 LAB — MICROSCOPIC EXAMINATION
Casts: NONE SEEN /lpf
RBC, Urine: NONE SEEN /hpf (ref 0–2)
WBC, UA: NONE SEEN /hpf (ref 0–5)

## 2021-07-04 LAB — ANTIBODY SCREEN: Antibody Screen: NEGATIVE

## 2021-07-04 LAB — RPR: RPR Ser Ql: NONREACTIVE

## 2021-07-04 LAB — HCV INTERPRETATION

## 2021-07-04 LAB — VARICELLA ZOSTER ANTIBODY, IGG: Varicella zoster IgG: <135 index — ABNORMAL LOW (ref >165)

## 2021-07-04 LAB — HGB SOLU + RFLX FRAC: Sickle Solubility Test - HGBRFX: NEGATIVE

## 2021-07-04 LAB — PARVOVIRUS B19 ANTIBODY, IGG AND IGM
Parvovirus B19 IgG: 4.7 index — ABNORMAL HIGH (ref 0.0–0.8)
Parvovirus B19 IgM: 0.5 index (ref 0.0–0.8)

## 2021-07-05 LAB — CULTURE, OB URINE

## 2021-07-05 LAB — URINE CULTURE, OB REFLEX

## 2021-07-06 ENCOUNTER — Encounter: Payer: Self-pay | Admitting: Obstetrics

## 2021-07-06 LAB — GC/CHLAMYDIA PROBE AMP
Chlamydia trachomatis, NAA: NEGATIVE
Neisseria Gonorrhoeae by PCR: NEGATIVE

## 2021-07-26 ENCOUNTER — Ambulatory Visit (INDEPENDENT_AMBULATORY_CARE_PROVIDER_SITE_OTHER): Payer: Medicaid Other | Admitting: Obstetrics

## 2021-07-26 VITALS — BP 111/70 | HR 79 | Wt 195.0 lb

## 2021-07-26 DIAGNOSIS — Z3481 Encounter for supervision of other normal pregnancy, first trimester: Secondary | ICD-10-CM

## 2021-07-26 DIAGNOSIS — Z1379 Encounter for other screening for genetic and chromosomal anomalies: Secondary | ICD-10-CM | POA: Diagnosis not present

## 2021-07-26 DIAGNOSIS — Z3A12 12 weeks gestation of pregnancy: Secondary | ICD-10-CM

## 2021-07-26 DIAGNOSIS — F419 Anxiety disorder, unspecified: Secondary | ICD-10-CM

## 2021-07-26 LAB — POCT URINALYSIS DIPSTICK OB
Glucose, UA: NEGATIVE
POC,PROTEIN,UA: NEGATIVE

## 2021-07-26 MED ORDER — HYDROXYZINE HCL 10 MG PO TABS: 10.0000 mg | ORAL_TABLET | Freq: Three times a day (TID) | ORAL | 0 refills | Status: DC | PRN

## 2021-07-26 NOTE — Progress Notes (Signed)
NEW OB HISTORY AND PHYSICAL ? ?SUBJECTIVE:  ?    ? Diana Kirby is a 31 y.o. [phone] female, Patient's last menstrual period was 04/28/2021 (exact date)., Estimated Date of Delivery: 02/02/22, [redacted]w[redacted]d, presents today for establishment of Prenatal Care. ?She reports headache, backache, and nausea that is resolving. She denies vaginal bleeding or unusual discharge. She is having significant anxiety and insomnia related to her previous two miscarriages and he concerns for the health of this pregnancy. She does not have many people she can openly talk to about her concerns and is interested in speaking with a counselor. ? ?Social history ?Partner/Relationship: FOB involved ?Living situation: lives with partner and children ?Work: hotel housekeeping ?Substance use: denies ?  ?Gynecologic History ?Patient's last menstrual period was 04/28/2021 (exact date). Normal ?Contraception: none ?Last Pap: 2022. Results were: normal ? ?Obstetric History ?OB History  ?Gravida Para Term Preterm AB Living  ?6 3 3  0 2 3  ?SAB IAB Ectopic Multiple Live Births  ?2     0 3  ?  ?# Outcome Date GA Lbr Len/2nd Weight Sex Delivery Anes PTL Lv  ?6 Current           ?5 Term 02/09/20 [redacted]w[redacted]d 25:14 / 00:35 8 lb 9.6 oz (3.9 kg) F Vag-Spont EPI  LIV  ?4 Term 07/13/11 [redacted]w[redacted]d  7 lb 6 oz (3.345 kg) F Vag-Spont  N LIV  ?3 Term 12/13/09 [redacted]w[redacted]d  8 lb 6 oz (3.799 kg) M Vag-Spont EPI N LIV  ?2 SAB           ?1 SAB           ? ? ?Past Medical History:  ?Diagnosis Date  ? Anxiety   ? Miscarriage   ? No pertinent past medical history   ? ? ?Past Surgical History:  ?Procedure Laterality Date  ? DILATION AND EVACUATION N/A 09/06/2020  ? Procedure: DILATATION AND EVACUATION;  Surgeon: 09/08/2020, MD;  Location: [facility] ORS;  Service: Gynecology;  Laterality: N/A;  ? NO PAST SURGERIES    ? no surgical history    ? ? ?Current Outpatient Medications on File Prior to Visit  ?Medication Sig Dispense Refill  ? ondansetron (ZOFRAN-ODT) 4 MG disintegrating tablet Take  1 tablet (4 mg total) by mouth every 8 (eight) hours as needed for nausea or vomiting. 20 tablet 0  ? Prenatal Vit-Fe Fumarate-FA (PRENATAL PLUS VITAMIN/MINERAL) 27-1 MG TABS Take 1 tablet by mouth daily. 60 tablet 4  ? ?No current facility-administered medications on file prior to visit.  ? ? ?Allergies  ?Allergen Reactions  ? Penicillins Hives  ? ? ?Social History  ? ?Socioeconomic History  ? Marital status: Significant Other  ?  Spouse name: Nigael  ? Number of children: Not on file  ? Years of education: Not on file  ? Highest education level: Not on file  ?Occupational History  ? Not on file  ?Tobacco Use  ? Smoking status: Never  ? Smokeless tobacco: Never  ?Vaping Use  ? Vaping Use: Never used  ?Substance and Sexual Activity  ? Alcohol use: Not Currently  ? Drug use: Never  ? Sexual activity: Yes  ?  Birth control/protection: Other-see comments  ?  Comment: undecided  ?Other Topics Concern  ? Not on file  ?Social History Narrative  ? Not on file  ? ?Social Determinants of Health  ? ?Financial Resource Strain: Not on file  ?Food Insecurity: Not on file  ?Transportation Needs: Not on file  ?Physical  Activity: Not on file  ?Stress: Not on file  ?Social Connections: Not on file  ?Intimate Partner Violence: Not on file  ? ? ?Family History  ?Problem Relation Age of Onset  ? Healthy Mother   ? Healthy Father   ? Lupus Paternal Aunt   ? Diabetes Paternal Aunt   ? Fibroids Paternal Aunt   ? Healthy Maternal Grandmother   ? Dementia Maternal Grandfather   ? Healthy Paternal Grandmother   ? ? ?The following portions of the patient's history were reviewed and updated as appropriate: allergies, current medications, past OB history, past medical history, past surgical history, past family history, past social history, and problem list. ? ?History obtained from the patient ?General ROS: negative for - chills, fatigue, or fever ?Psychological ROS: positive for - anxiety and sleep disturbances ?Endocrine ROS: negative for -  breast changes, hot flashes, or palpitations ?Breast ROS: negative for breast lumps ?Respiratory ROS: no cough, shortness of breath, or wheezing ?Cardiovascular ROS: no chest pain or dyspnea on exertion ?Gastrointestinal ROS: no abdominal pain, change in bowel habits, or black or bloody stools ?positive for - constipation ?Genito-Urinary ROS: no dysuria, trouble voiding, or hematuria ?Musculoskeletal ROS: negative ?Dermatological ROS: negative ? ? ?OBJECTIVE: ?Initial Physical Exam (New OB) ? ?GENERAL APPEARANCE: alert, well appearing, in no apparent distress ?HEAD: normocephalic, atraumatic ?MOUTH: mucous membranes moist, pharynx normal without lesions ?THYROID: no thyromegaly or masses present ?BREASTS: no masses noted, no significant tenderness, no palpable axillary nodes, no skin changes ?LUNGS: clear to auscultation, no wheezes, rales or rhonchi, symmetric air entry ?HEART: regular rate and rhythm, no murmurs ?ABDOMEN: soft, nontender, nondistended, no abnormal masses, no epigastric pain, FHT present: 135 ?EXTREMITIES: no redness or tenderness in the calves or thighs ?SKIN: normal coloration and turgor, no rashes ?LYMPH NODES: no adenopathy palpable ?NEUROLOGIC: alert, oriented, normal speech, no focal findings or movement disorder noted ? ?PELVIC EXAM deferred ? ?ASSESSMENT: ?Normal pregnancy at [redacted]w[redacted]d ?Anxiety ? ? ?PLAN: ?Routine prenatal care. We discussed an overview of prenatal care and when to call. Reviewed diet, exercise, and weight gain recommendations in pregnancy. We discussed good sleep hygiene and natural ways to manage anxiety and insomnia. Recommended hydroxyzine PRN at bedtime for sleep and anxiety and referral to counselor. Discussed benefits of breastfeeding and lactation resources at [facility]. I reviewed labs and answered all questions.ROB in 4 weeks. Return sooner for concerns. ? ?See orders ? ?[name], CNM  ?

## 2021-07-27 LAB — CBC WITH DIFFERENTIAL/PLATELET
Basophils Absolute: 0 10*3/uL (ref 0.0–0.2)
Basos: 0 %
EOS (ABSOLUTE): 0 10*3/uL (ref 0.0–0.4)
Eos: 0 %
Hematocrit: 39.5 % (ref 34.0–46.6)
Hemoglobin: 12.2 g/dL (ref 11.1–15.9)
Immature Grans (Abs): 0 10*3/uL (ref 0.0–0.1)
Immature Granulocytes: 0 %
Lymphocytes Absolute: 2.3 10*3/uL (ref 0.7–3.1)
Lymphs: 34 %
MCH: 26.9 pg (ref 26.6–33.0)
MCHC: 30.9 g/dL — ABNORMAL LOW (ref 31.5–35.7)
MCV: 87 fL (ref 79–97)
Monocytes Absolute: 0.5 10*3/uL (ref 0.1–0.9)
Monocytes: 7 %
Neutrophils Absolute: 4 10*3/uL (ref 1.4–7.0)
Neutrophils: 59 %
Platelets: 291 10*3/uL (ref 150–450)
RBC: 4.54 x10E6/uL (ref 3.77–5.28)
RDW: 13 % (ref 11.7–15.4)
WBC: 6.8 10*3/uL (ref 3.4–10.8)

## 2021-07-30 ENCOUNTER — Encounter: Payer: Self-pay | Admitting: Obstetrics

## 2021-07-30 LAB — MATERNIT21 PLUS CORE+SCA
Fetal Fraction: 10
Monosomy X (Turner Syndrome): NOT DETECTED
Result (T21): NEGATIVE
Trisomy 13 (Patau syndrome): NEGATIVE
Trisomy 18 (Edwards syndrome): NEGATIVE
Trisomy 21 (Down syndrome): NEGATIVE
XXX (Triple X Syndrome): NOT DETECTED
XXY (Klinefelter Syndrome): NOT DETECTED
XYY (Jacobs Syndrome): NOT DETECTED

## 2021-08-03 ENCOUNTER — Encounter: Payer: Self-pay | Admitting: Certified Nurse Midwife

## 2021-08-24 ENCOUNTER — Ambulatory Visit (INDEPENDENT_AMBULATORY_CARE_PROVIDER_SITE_OTHER): Payer: Medicaid Other | Admitting: Certified Nurse Midwife

## 2021-08-24 VITALS — BP 81/54 | HR 93 | Wt 193.0 lb

## 2021-08-24 DIAGNOSIS — Z3A16 16 weeks gestation of pregnancy: Secondary | ICD-10-CM

## 2021-08-24 LAB — POCT URINALYSIS DIPSTICK OB
Bilirubin, UA: NEGATIVE
Glucose, UA: NEGATIVE
Leukocytes, UA: NEGATIVE
Nitrite, UA: NEGATIVE
Urobilinogen, UA: 0.2 E.U./dL
pH, UA: 5 (ref 5.0–8.0)

## 2021-08-24 MED ORDER — ASPIRIN 81 MG PO TBEC: 81.0000 mg | DELAYED_RELEASE_TABLET | Freq: Every day | ORAL | 12 refills | Status: DC

## 2021-08-24 NOTE — Addendum Note (Signed)
Addended by: Donnetta Hail on: 08/24/2021 11:14 AM   Modules accepted: Orders

## 2021-08-24 NOTE — Addendum Note (Signed)
Addended by: Mechele Claude on: 08/24/2021 11:06 AM   Modules accepted: Orders

## 2021-08-24 NOTE — Patient Instructions (Signed)
Round Ligament Pain  The round ligaments are a pair of cord-like tissues that help support the uterus. They can become a source of pain during pregnancy as the ligaments soften and stretch as the baby grows. The pain usually begins in the second trimester (13-28 weeks) of pregnancy, and should only last for a few seconds when it occurs. However, the pain can come and go until the baby is delivered. The pain does not cause harm to the baby. Round ligament pain is usually a short, sharp, and pinching pain, but it can also be a dull, lingering, and aching pain. The pain is felt in the lower side of the abdomen or in the groin. It usually starts deep in the groin and moves up to the outside of the hip area. The pain may happen when you: Suddenly change position, such as quickly going from a sitting to standing position. Do physical activity. Cough or sneeze. Follow these instructions at home: Managing pain  When the pain starts, relax. Then, try any of these methods to help with the pain: Sit down. Flex your knees up to your abdomen. Lie on your side with one pillow under your abdomen and another pillow between your legs. Sit in a warm bath for 15-20 minutes or until the pain goes away. General instructions Watch your condition for any changes. Move slowly when you sit down or stand up. Stop or reduce your physical activities if they cause pain. Avoid long walks if they cause pain. Take over-the-counter and prescription medicines only as told by your health care provider. Keep all follow-up visits. This is important. Contact a health care provider if: Your pain does not go away with treatment. You feel pain in your back that you did not have before. Your medicine is not helping. You have a fever or chills. You have nausea or vomiting. You have diarrhea. You have pain when you urinate. Get help right away if: You have pain that is a rhythmic, cramping pain similar to labor pains. Labor  pains are usually 2 minutes apart, last for about 1 minute, and involve a bearing down feeling or pressure in your pelvis. You have vaginal bleeding. These symptoms may represent a serious problem that is an emergency. Do not wait to see if the symptoms will go away. Get medical help right away. Call your local emergency services (911 in the [place] Do not drive yourself to the hospital. Summary Round ligament pain is felt in the lower abdomen or groin. This pain usually begins in the second trimester (13-28 weeks) and should only last for a few seconds when it occurs. You may notice the pain when you suddenly change position, when you cough or sneeze, or during physical activity. Relaxing, flexing your knees to your abdomen, lying on one side, or taking a warm bath may help to get rid of the pain. Contact your health care provider if the pain does not go away. This information is not intended to replace advice given to you by your health care provider. Make sure you discuss any questions you have with your health care provider. Document Revised: 06/08/2020 Document Reviewed: 06/08/2020 Elsevier Patient Education  2023 Elsevier Inc.  

## 2021-08-24 NOTE — Progress Notes (Addendum)
ROB doing well, having round ligament pain. Self help measures reviewed. Encouraged use of belly band. Discussed u/s next visit for anatomy. She verbalizes and agrees to plan. Urine culture sent due to blood, ketones present.   Follow up 4 wks for u/s and ROB.   Body mass index is 34.19 kg/m.   Doreene Burke, CNM

## 2021-08-26 LAB — URINE CULTURE

## 2021-09-18 ENCOUNTER — Ambulatory Visit (INDEPENDENT_AMBULATORY_CARE_PROVIDER_SITE_OTHER): Payer: Medicaid Other

## 2021-09-18 ENCOUNTER — Ambulatory Visit (INDEPENDENT_AMBULATORY_CARE_PROVIDER_SITE_OTHER): Payer: Medicaid Other | Admitting: Obstetrics

## 2021-09-18 VITALS — BP 96/67 | HR 89 | Wt 192.5 lb

## 2021-09-18 DIAGNOSIS — Z3482 Encounter for supervision of other normal pregnancy, second trimester: Secondary | ICD-10-CM

## 2021-09-18 DIAGNOSIS — Z3A20 20 weeks gestation of pregnancy: Secondary | ICD-10-CM | POA: Diagnosis not present

## 2021-09-18 DIAGNOSIS — Z3A16 16 weeks gestation of pregnancy: Secondary | ICD-10-CM

## 2021-09-18 LAB — POCT URINALYSIS DIPSTICK OB
Bilirubin, UA: NEGATIVE
Blood, UA: NEGATIVE
Glucose, UA: NEGATIVE
Ketones, UA: NEGATIVE
Leukocytes, UA: NEGATIVE
Nitrite, UA: NEGATIVE
POC,PROTEIN,UA: NEGATIVE
Spec Grav, UA: 1.015 (ref 1.010–1.025)
Urobilinogen, UA: 0.2 E.U./dL
pH, UA: 7 (ref 5.0–8.0)

## 2021-09-18 NOTE — Progress Notes (Signed)
ROB at [redacted]w[redacted]d. Active baby. Denies ctx, LOF, vaginal bleeding. Having to get up at night to void multiple times. Getting some rest with occasional hydroxyzine. 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 Anatomy US today - WNL. Answered questions re: low-dose aspirin. RTC  In 4 weeks.  [name], CNM

## 2021-09-25 ENCOUNTER — Encounter: Payer: Self-pay | Admitting: Obstetrics

## 2021-10-17 ENCOUNTER — Encounter: Payer: Medicaid Other | Admitting: Certified Nurse Midwife

## 2021-10-17 DIAGNOSIS — Z3A24 24 weeks gestation of pregnancy: Secondary | ICD-10-CM

## 2021-10-17 DIAGNOSIS — Z3482 Encounter for supervision of other normal pregnancy, second trimester: Secondary | ICD-10-CM

## 2021-10-18 ENCOUNTER — Ambulatory Visit (INDEPENDENT_AMBULATORY_CARE_PROVIDER_SITE_OTHER): Payer: Medicaid Other | Admitting: Certified Nurse Midwife

## 2021-10-18 VITALS — BP 97/63 | HR 106 | Wt 204.5 lb

## 2021-10-18 DIAGNOSIS — Z3A24 24 weeks gestation of pregnancy: Secondary | ICD-10-CM

## 2021-10-18 NOTE — Progress Notes (Signed)
ROB doing well, feeling movement. Discussed 28 wk visit , information sheet given on eating prior to glucose screen. She verbalizes and agrees. Pt state she does not do well with the test as she always gets sick. Discussed alternatives to the glucoala drink. Copy of options given with instructions to bring it with her. Follow up 4 wks for 1 hr glucose and ROB with Missy.   Doreene Burke, CNM

## 2021-10-18 NOTE — Patient Instructions (Signed)
Oral Glucose Tolerance Test During Pregnancy Why am I having this test? The oral glucose tolerance test (OGTT) is done to check how your body processes blood sugar (glucose). This is one of several tests used to diagnose diabetes that develops during pregnancy (gestational diabetes mellitus). Gestational diabetes is a short-term form of diabetes that some women develop while they are pregnant. It usually occurs during the second trimester of pregnancy and goes away after delivery. Testing, or screening, for gestational diabetes usually occurs at weeks 24-28 of pregnancy. You may have the OGTT test after having a 1-hour glucose screening test if the results from that test indicate that you may have gestational diabetes. This test may also be needed if: You have a history of gestational diabetes. There is a history of giving birth to very large babies or of losing pregnancies (having stillbirths). You have signs and symptoms of diabetes, such as: Changes in your eyesight. Tingling or numbness in your hands or feet. Changes in hunger, thirst, and urination, and these are not explained by your pregnancy. What is being tested? This test measures the amount of glucose in your blood at different times during a period of 3 hours. This shows how well your body can process glucose. What kind of sample is taken?  Blood samples are required for this test. They are usually collected by inserting a needle into a blood vessel. How do I prepare for this test? For 3 days before your test, eat normally. Have plenty of carbohydrate-rich foods. Follow instructions from your health care provider about: Eating or drinking restrictions on the day of the test. You may be asked not to eat or drink anything other than water (to fast) starting 8-10 hours before the test. Changing or stopping your regular medicines. Some medicines may interfere with this test. Tell a health care provider about: All medicines you are  taking, including vitamins, herbs, eye drops, creams, and over-the-counter medicines. Any blood disorders you have. Any surgeries you have had. Any medical conditions you have. What happens during the test? First, your blood glucose will be measured. This is referred to as your fasting blood glucose because you fasted before the test. Then, you will drink a glucose solution that contains a certain amount of glucose. Your blood glucose will be measured again 1, 2, and 3 hours after you drink the solution. This test takes about 3 hours to complete. You will need to stay at the testing location during this time. During the testing period: Do not eat or drink anything other than the glucose solution. Do not exercise. Do not use any products that contain nicotine or tobacco, such as cigarettes, e-cigarettes, and chewing tobacco. These can affect your test results. If you need help quitting, ask your health care provider. The testing procedure may vary among health care providers and hospitals. How are the results reported? Your results will be reported as milligrams of glucose per deciliter of blood (mg/dL) or millimoles per liter (mmol/L). There is more than one source for screening and diagnosis reference values used to diagnose gestational diabetes. Your health care provider will compare your results to normal values that were established after testing a large group of people (reference values). Reference values may vary among labs and hospitals. For this test (Carpenter-Coustan), reference values are: Fasting: 95 mg/dL (5.3 mmol/L). 1 hour: 180 mg/dL (10.0 mmol/L). 2 hour: 155 mg/dL (8.6 mmol/L). 3 hour: 140 mg/dL (7.8 mmol/L). What do the results mean? Results below the reference values are   considered normal. If two or more of your blood glucose levels are at or above the reference values, you may be diagnosed with gestational diabetes. If only one level is high, your health care provider may  suggest repeat testing or other tests to confirm a diagnosis. Talk with your health care provider about what your results mean. Questions to ask your health care provider Ask your health care provider, or the department that is doing the test: When will my results be ready? How will I get my results? What are my treatment options? What other tests do I need? What are my next steps? Summary The oral glucose tolerance test (OGTT) is one of several tests used to diagnose diabetes that develops during pregnancy (gestational diabetes mellitus). Gestational diabetes is a short-term form of diabetes that some women develop while they are pregnant. You may have the OGTT test after having a 1-hour glucose screening test if the results from that test show that you may have gestational diabetes. You may also have this test if you have any symptoms or risk factors for this type of diabetes. Talk with your health care provider about what your results mean. This information is not intended to replace advice given to you by your health care provider. Make sure you discuss any questions you have with your health care provider. Document Revised: 09/03/2019 Document Reviewed: 09/03/2019 Elsevier Patient Education  2023 Elsevier Inc.  

## 2021-11-20 ENCOUNTER — Ambulatory Visit (INDEPENDENT_AMBULATORY_CARE_PROVIDER_SITE_OTHER): Payer: Medicaid Other | Admitting: Obstetrics

## 2021-11-20 ENCOUNTER — Encounter: Payer: Self-pay | Admitting: Obstetrics

## 2021-11-20 ENCOUNTER — Other Ambulatory Visit: Payer: Medicaid Other

## 2021-11-20 VITALS — BP 107/70 | HR 68 | Wt 209.3 lb

## 2021-11-20 DIAGNOSIS — Z23 Encounter for immunization: Secondary | ICD-10-CM

## 2021-11-20 DIAGNOSIS — Z3A29 29 weeks gestation of pregnancy: Secondary | ICD-10-CM | POA: Diagnosis not present

## 2021-11-20 DIAGNOSIS — Z3483 Encounter for supervision of other normal pregnancy, third trimester: Secondary | ICD-10-CM

## 2021-11-20 LAB — POCT URINALYSIS DIPSTICK OB
Bilirubin, UA: NEGATIVE
Blood, UA: NEGATIVE
Glucose, UA: NEGATIVE
Ketones, UA: NEGATIVE
Leukocytes, UA: NEGATIVE
Nitrite, UA: NEGATIVE
POC,PROTEIN,UA: NEGATIVE
Spec Grav, UA: 1.02
Urobilinogen, UA: 0.2 U/dL
pH, UA: 6.5

## 2021-11-20 NOTE — Progress Notes (Signed)
ROB at [redacted]w[redacted]d. Very active baby. Having some BH ctx. Denies vaginal bleeding and LOF. Shree feels like her iron level may be low. She has been feeling moody and irritable lately. Discussed comfort measures, encouraged to consider counseling/medication for anxiety. Discussed birth plan - may want an epidural, plans to go with the flow. Concerned about gaining enough weight. Encouraged high-protein snacks. Considering BTL or IUD. 1-hour glucose, CBC, RPR today. TDaP, RSB, BTC done. RTC in 2 weeks.  [name], CNM

## 2021-11-21 ENCOUNTER — Encounter: Payer: Self-pay | Admitting: Obstetrics

## 2021-11-21 LAB — RPR: RPR Ser Ql: NONREACTIVE

## 2021-11-21 LAB — CBC
Hematocrit: 35.1 % (ref 34.0–46.6)
Hemoglobin: 11.5 g/dL (ref 11.1–15.9)
MCH: 28.5 pg (ref 26.6–33.0)
MCHC: 32.8 g/dL (ref 31.5–35.7)
MCV: 87 fL (ref 79–97)
Platelets: 187 10*3/uL (ref 150–450)
RBC: 4.03 x10E6/uL (ref 3.77–5.28)
RDW: 12.4 % (ref 11.7–15.4)
WBC: 6.9 10*3/uL (ref 3.4–10.8)

## 2021-11-21 LAB — GLUCOSE, 1 HOUR GESTATIONAL: Gestational Diabetes Screen: 97 mg/dL (ref 70–139)

## 2021-11-22 ENCOUNTER — Encounter: Payer: Self-pay | Admitting: Obstetrics

## 2021-12-12 ENCOUNTER — Ambulatory Visit (INDEPENDENT_AMBULATORY_CARE_PROVIDER_SITE_OTHER): Payer: Medicaid Other | Admitting: Obstetrics

## 2021-12-12 ENCOUNTER — Encounter: Payer: Self-pay | Admitting: Obstetrics

## 2021-12-12 VITALS — BP 111/71 | HR 87 | Wt 211.0 lb

## 2021-12-12 DIAGNOSIS — Z3A32 32 weeks gestation of pregnancy: Secondary | ICD-10-CM

## 2021-12-12 LAB — POCT URINALYSIS DIPSTICK OB
Bilirubin, UA: NEGATIVE
Glucose, UA: NEGATIVE
Ketones, UA: NEGATIVE
Leukocytes, UA: NEGATIVE
Nitrite, UA: NEGATIVE
POC,PROTEIN,UA: NEGATIVE
Spec Grav, UA: 1.02 (ref 1.010–1.025)
Urobilinogen, UA: 0.2 E.U./dL
pH, UA: 6 (ref 5.0–8.0)

## 2021-12-12 NOTE — Progress Notes (Signed)
ROB at [redacted]w[redacted]d. Active baby. Yasuko denies ctx, vaginal bleeding, and LOF. She is getting a little bit of sleep now and trying to manage her anxiety. She feels hot all the time. She reports that a few days ago she had an episode of "seeing stars" and feeling lightheaded after having a bowel movement. Discussed physiologic changes of pregnancy and vagal response to Valsalva. She is concerned about not gaining enough weight. Reassured that weight gain has been adequate and fundal heights are appropriate.  Shailah is mostly sure she would like a BTL. Consent signed today. Baby feels breech by Leopold's. Encouraged to do [employer] exercises. RTC in 2 weeks.  [name], CNM

## 2021-12-26 ENCOUNTER — Ambulatory Visit (INDEPENDENT_AMBULATORY_CARE_PROVIDER_SITE_OTHER): Payer: Medicaid Other | Admitting: Certified Nurse Midwife

## 2021-12-26 VITALS — BP 107/71 | HR 114 | Wt 209.2 lb

## 2021-12-26 DIAGNOSIS — Z3A34 34 weeks gestation of pregnancy: Secondary | ICD-10-CM

## 2021-12-26 NOTE — Patient Instructions (Signed)
Braxton Hicks Contractions  Contractions of the uterus can occur throughout pregnancy, but they are not always a sign that you are in labor. You may have practice contractions called Braxton Hicks contractions. These false labor contractions are sometimes confused with true labor. What are Braxton Hicks contractions? Braxton Hicks contractions are tightening movements that occur in the muscles of the uterus before labor. Unlike true labor contractions, these contractions do not result in opening (dilation) and thinning of the lowest part of the uterus (cervix). Toward the end of pregnancy (32-34 weeks), Braxton Hicks contractions can happen more often and may become stronger. These contractions are sometimes difficult to tell apart from true labor because they can be very uncomfortable. How to tell the difference between true labor and false labor True labor Contractions last 30-70 seconds. Contractions become very regular. Discomfort is usually felt in the top of the uterus, and it spreads to the lower abdomen and low back. Contractions do not go away with walking. Contractions usually become stronger and more frequent. The cervix dilates and gets thinner. False labor Contractions are usually shorter, weaker, and farther apart than true labor contractions. Contractions are usually irregular. Contractions are often felt in the front of the lower abdomen and in the groin. Contractions may go away when you walk around or change positions while lying down. The cervix usually does not dilate or become thin. Sometimes, the only way to tell if you are in true labor is for your health care provider to look for changes in your cervix. Your health care provider will do a physical exam and may monitor your contractions. If you are in true labor, your health care provider will send you home with instructions about when to return to the hospital. You may continue to have Braxton Hicks contractions until you  go into true labor. Follow these instructions at home:  Take over-the-counter and prescription medicines only as told by your health care provider. If Braxton Hicks contractions are making you uncomfortable: Change your position from lying down or resting to walking, or change from walking to resting. Sit and rest in a tub of warm water. Drink enough fluid to keep your urine pale yellow. Dehydration may cause these contractions. Do slow and deep breathing several times an hour. Keep all follow-up visits. This is important. Contact a health care provider if: You have a fever. You have continuous pain in your abdomen. Your contractions become stronger, more regular, and closer together. You pass blood-tinged mucus. Get help right away if: You have fluid leaking or gushing from your vagina. You have bright red blood coming from your vagina. Your baby is not moving inside you as much as it used to. Summary You may have practice contractions called Braxton Hicks contractions. These false labor contractions are sometimes confused with true labor. Braxton Hicks contractions are usually shorter, weaker, farther apart, and less regular than true labor contractions. True labor contractions usually become stronger, more regular, and more frequent. Manage discomfort from Braxton Hicks contractions by changing position, resting in a warm bath, practicing deep breathing, and drinking plenty of water. Keep all follow-up visits. Contact your health care provider if your contractions become stronger, more regular, and closer together. This information is not intended to replace advice given to you by your health care provider. Make sure you discuss any questions you have with your health care provider. Document Revised: 02/01/2020 Document Reviewed: 02/01/2020 Elsevier Patient Education  2023 Elsevier Inc.  

## 2021-12-26 NOTE — Progress Notes (Signed)
ROB doing well, feeling good movement. Discussed [employer] , handout given. Discussed next appointment at new office. She verbalize and agrees . Follow up 2 wk for ROB.    Philip Aspen, CNM

## 2022-01-10 ENCOUNTER — Encounter: Payer: Self-pay | Admitting: Licensed Practical Nurse

## 2022-01-10 ENCOUNTER — Ambulatory Visit (INDEPENDENT_AMBULATORY_CARE_PROVIDER_SITE_OTHER): Payer: Medicaid Other | Admitting: Licensed Practical Nurse

## 2022-01-10 VITALS — BP 107/75 | HR 97 | Wt 212.0 lb

## 2022-01-10 DIAGNOSIS — Z3A36 36 weeks gestation of pregnancy: Secondary | ICD-10-CM

## 2022-01-10 DIAGNOSIS — R300 Dysuria: Secondary | ICD-10-CM

## 2022-01-10 DIAGNOSIS — Z3480 Encounter for supervision of other normal pregnancy, unspecified trimester: Secondary | ICD-10-CM

## 2022-01-10 DIAGNOSIS — O99343 Other mental disorders complicating pregnancy, third trimester: Secondary | ICD-10-CM

## 2022-01-10 DIAGNOSIS — Z3685 Encounter for antenatal screening for Streptococcus B: Secondary | ICD-10-CM

## 2022-01-10 DIAGNOSIS — F418 Other specified anxiety disorders: Secondary | ICD-10-CM

## 2022-01-10 LAB — POCT URINALYSIS DIPSTICK
Blood, UA: 1.01
Glucose, UA: NEGATIVE
Protein, UA: NEGATIVE
Spec Grav, UA: 1.01 (ref 1.010–1.025)
Urobilinogen, UA: 1 E.U./dL
pH, UA: 6 (ref 5.0–8.0)

## 2022-01-10 NOTE — Progress Notes (Signed)
Routine Prenatal Care Visit  Subjective  Diana Kirby is a 31 y.o. [phone] at [redacted]w[redacted]d being seen today for ongoing prenatal care.  She is currently monitored for the following issues for this low-risk pregnancy and has Anxiety and depression; Supervision of other normal pregnancy, antepartum; and Missed abortion on their problem list.  ----------------------------------------------------------------------------------- Patient reports  insomnia, dysuria for a few days, low back pain  .  Here with Floreen Comber.  -Doing ok, mood is so-so, had PPD with each child, encouraged couple to discuss PP Plan, pt aware medication and therapy are available options.  Floreen Comber will have 2 weeks off once the baby is born. -comfort measures for back pain and sleep reviewed.  -BSUS confirms vertex  Contractions: Irritability. Vag. Bleeding: None.  Movement: Present. Leaking Fluid denies.  ----------------------------------------------------------------------------------- The following portions of the patient's history were reviewed and updated as appropriate: allergies, current medications, past family history, past medical history, past social history, past surgical history and problem list. Problem list updated.  Objective  Blood pressure 107/75, pulse 97, weight 212 lb (96.2 kg), last menstrual period 04/28/2021, unknown if currently breastfeeding. Pregravid weight 195 lb (88.5 kg) Total Weight Gain 17 lb (7.711 kg) Urinalysis: Urine Protein    Urine Glucose    Fetal Status: Fetal Heart Rate (bpm): 150 Fundal Height: 37 cm Movement: Present  Presentation: Vertex  General:  Alert, oriented and cooperative. Patient is in no acute distress.  Skin: Skin is warm and dry. No rash noted.   Cardiovascular: Normal heart rate noted  Respiratory: Normal respiratory effort, no problems with respiration noted  Abdomen: Soft, gravid, appropriate for gestational age. Pain/Pressure: Present     Pelvic:  Cervical exam deferred         Extremities: Normal range of motion.  Edema: None  Mental Status: Normal mood and affect. Normal behavior. Normal judgment and thought content.   Assessment   31 y.o. [id number] at [redacted]w[redacted]d by  02/02/2022, by Last Menstrual Period presenting for routine prenatal visit  Plan   G6 Problems (from 07/03/21 to present)     No problems associated with this episode.        Preterm labor symptoms and general obstetric precautions including but not limited to vaginal bleeding, contractions, leaking of fluid and fetal movement were reviewed in detail with the patient. Please refer to After Visit Summary for other counseling recommendations.   Return in about 1 week (around 01/17/2022) for ROB.  36 wk labs and urine culture sent   Roberto Scales, [place], [facility]  01/10/22  11:54 AM

## 2022-01-12 LAB — URINE CULTURE

## 2022-01-14 LAB — STREP GP B SUSCEPTIBILITY

## 2022-01-14 LAB — STREP GP B CULTURE+RFLX: Strep Gp B Culture+Rflx: POSITIVE — AB

## 2022-01-17 ENCOUNTER — Observation Stay
Admission: EM | Admit: 2022-01-17 | Discharge: 2022-01-17 | Disposition: A | Discharge status: Home / Self Care | Payer: Medicaid Other | Attending: Obstetrics and Gynecology | Admitting: Obstetrics and Gynecology

## 2022-01-17 ENCOUNTER — Encounter: Payer: Self-pay | Admitting: Obstetrics and Gynecology

## 2022-01-17 ENCOUNTER — Other Ambulatory Visit: Payer: Self-pay

## 2022-01-17 DIAGNOSIS — O26893 Other specified pregnancy related conditions, third trimester: Secondary | ICD-10-CM | POA: Diagnosis not present

## 2022-01-17 DIAGNOSIS — R519 Headache, unspecified: Secondary | ICD-10-CM | POA: Diagnosis not present

## 2022-01-17 DIAGNOSIS — Z79899 Other long term (current) drug therapy: Secondary | ICD-10-CM | POA: Insufficient documentation

## 2022-01-17 DIAGNOSIS — Z3490 Encounter for supervision of normal pregnancy, unspecified, unspecified trimester: Secondary | ICD-10-CM

## 2022-01-17 DIAGNOSIS — Z3A37 37 weeks gestation of pregnancy: Secondary | ICD-10-CM | POA: Diagnosis not present

## 2022-01-17 DIAGNOSIS — Z7982 Long term (current) use of aspirin: Secondary | ICD-10-CM | POA: Insufficient documentation

## 2022-01-17 DIAGNOSIS — O368130 Decreased fetal movements, third trimester, not applicable or unspecified: Principal | ICD-10-CM | POA: Insufficient documentation

## 2022-01-17 MED ORDER — METOCLOPRAMIDE HCL 10 MG/10ML PO SOLN
10.0000 mg | Freq: Once | ORAL | Status: AC
  Administered 2022-01-17: 10 mg via ORAL
  Filled 2022-01-17: qty 10

## 2022-01-17 MED ORDER — ACETAMINOPHEN 500 MG PO TABS: 1000.0000 mg | ORAL_TABLET | Freq: Four times a day (QID) | ORAL | 0 refills | Status: AC | PRN

## 2022-01-17 MED ORDER — ACETAMINOPHEN 500 MG PO TABS
1000.0000 mg | ORAL_TABLET | Freq: Four times a day (QID) | ORAL | Status: DC | PRN
  Administered 2022-01-17: 1000 mg via ORAL
  Filled 2022-01-17: qty 2

## 2022-01-17 NOTE — Discharge Summary (Signed)
Physician Final Progress Note  Patient ID: Diana Kirby MRN: [id number] DOB/AGE: 07-03-1990 31 y.o.  Admit date: 01/17/2022 Admitting provider: Harlin Heys, MD Discharge date: 01/17/2022   Admission Diagnoses:  1) intrauterine pregnancy at [redacted]w[redacted]d  2) Headache 3) Decreased Fetal movement   Discharge Diagnoses:  Active Problems:   * No active hospital problems. *  Headache in pregnancy   History of Present Illness: The patient is a 31 y.o. female [phone] at [redacted]w[redacted]d who presents for evaluation for a headache and decreased fetal movement. The headache has been present over the last 2 days, it is int he front along her forehead adm temples and feels like pressure, yesterday she did not "feel well" and had some nausea.  She has not felt movement in 24 hours despite trying to stimulate the fetus by drinking cold sweet beverages.  Normally she feels the fetus throughout the day. Denies any LOF/VB or contractions. Denies any visual changes or RUQ pain. She tends to get headaches, occasionally she has had to go to the  hospital for IV caffeine. She did take 650mg  Tylenol last night with minimal relief.   Past Medical History:  Diagnosis Date   Anxiety    Miscarriage    No pertinent past medical history     Past Surgical History:  Procedure Laterality Date   DILATION AND EVACUATION N/A 09/06/2020   Procedure: DILATATION AND EVACUATION;  Surgeon: Homero Fellers, MD;  Location: [facility] ORS;  Service: Gynecology;  Laterality: N/A;   NO PAST SURGERIES     no surgical history      No current facility-administered medications on file prior to encounter.   Current Outpatient Medications on File Prior to Encounter  Medication Sig Dispense Refill   Prenatal Vit-Fe Fumarate-FA (PRENATAL PLUS VITAMIN/MINERAL) 27-1 MG TABS Take 1 tablet by mouth daily. 60 tablet 4   aspirin EC 81 MG tablet Take 1 tablet (81 mg total) by mouth daily. Swallow whole. (Patient not taking: Reported on  01/10/2022) 30 tablet 12   hydrOXYzine (ATARAX) 10 MG tablet Take 1 tablet (10 mg total) by mouth 3 (three) times daily as needed. (Patient not taking: Reported on 08/24/2021) 30 tablet 0   ondansetron (ZOFRAN-ODT) 4 MG disintegrating tablet Take 1 tablet (4 mg total) by mouth every 8 (eight) hours as needed for nausea or vomiting. (Patient not taking: Reported on 08/24/2021) 20 tablet 0    Allergies  Allergen Reactions   Penicillins Hives    Social History   Socioeconomic History   Marital status: Significant Other    Spouse name: Nigael   Number of children: Not on file   Years of education: Not on file   Highest education level: Not on file  Occupational History   Not on file  Tobacco Use   Smoking status: Never   Smokeless tobacco: Never  Vaping Use   Vaping Use: Never used  Substance and Sexual Activity   Alcohol use: Not Currently   Drug use: Never   Sexual activity: Yes    Birth control/protection: Other-see comments    Comment: undecided  Other Topics Concern   Not on file  Social History Narrative   Not on file   Social Determinants of Health   Financial Resource Strain: Not on file  Food Insecurity: Not on file  Transportation Needs: Not on file  Physical Activity: Not on file  Stress: Not on file  Social Connections: Not on file  Intimate Partner Violence: Not on file  Family History  Problem Relation Age of Onset   Healthy Mother    Healthy Father    Lupus Paternal Aunt    Diabetes Paternal Aunt    Fibroids Paternal Aunt    Healthy Maternal Grandmother    Dementia Maternal Grandfather    Healthy Paternal Grandmother      Review of Systems  Constitutional: Negative.   Eyes:  Negative for blurred vision.  Respiratory: Negative.    Cardiovascular: Negative.   Gastrointestinal:  Positive for nausea.  Genitourinary: Negative.   Musculoskeletal: Negative.   Neurological:  Positive for headaches.  Psychiatric/Behavioral: Negative.        Physical Exam: BP 113/71   Pulse 96   Temp 98.8 F (37.1 C)   Ht 5\' 3"  (1.6 m)   Wt 96.2 kg   LMP 04/28/2021 (Exact Date)   BMI 37.55 kg/m   Physical Exam Constitutional:      Appearance: Normal appearance.  Cardiovascular:     Rate and Rhythm: Normal rate.  Pulmonary:     Effort: Pulmonary effort is normal.  Abdominal:     Tenderness: There is no abdominal tenderness.     Comments: Gravid   Musculoskeletal:     Cervical back: Normal range of motion.  Neurological:     Mental Status: She is alert.  Skin:    General: Skin is warm.  Psychiatric:        Mood and Affect: Mood normal.    EFM: baseline 135, moderate variability, pos accel, neg decel  TOCO: irregular contractions  Consults: None  Significant Findings/ Diagnostic Studies: NA  Procedures: RNST   Hospital Course: The patient was admitted to Labor and Delivery Triage for observation. She was given 1,000mg  Tylenol, 10mg  Reglan and a cola for the headache.  After some time she reported her headache decreased to a "3/10" and she felt the fetus make a big movement and feels reassured. Pt desires to go home.   Discharge Condition: good  Disposition: Discharge disposition: 01-Home or Self Care     Keep next ROB 10/13   Diet: Regular diet  Discharge Activity: Activity as tolerated   Allergies as of 01/17/2022       Reactions   Penicillins Hives        Medication List     STOP taking these medications    aspirin EC 81 MG tablet   hydrOXYzine 10 MG tablet Commonly known as: ATARAX   ondansetron 4 MG disintegrating tablet Commonly known as: ZOFRAN-ODT       TAKE these medications    acetaminophen 500 MG tablet Commonly known as: TYLENOL Take 2 tablets (1,000 mg total) by mouth every 6 (six) hours as needed for moderate pain.   Prenatal Plus Vitamin/Mineral 27-1 MG Tabs Take 1 tablet by mouth daily.           SignedJillene Bucks Treyson Axel, CNM  01/17/2022, 2:40 PM

## 2022-01-17 NOTE — Discharge Instructions (Signed)
To prevent Headache: Magnesium oxide 400mg  daily Riboflavin 400mg  daily Co Enzyme Q 10 150mg  daily  For a Bad headache: 1,000mg  Tylenol, 25-50 mg Benadryl, 1 can of coke or 1 cup of coffee

## 2022-01-17 NOTE — OB Triage Note (Addendum)
Pt is a 31yo [id number], 37w 5d. She arrived to the unit with complaints of 9/10 headache, nausea, and decreased fetal movement for the past 24hrs. She denies vaginal bleeding/discharge. VS stable, monitors applied and assessing.   Initial FHT 125 at 1220.

## 2022-01-17 NOTE — Progress Notes (Signed)
Discharge instructions provided to pt. Pt verbalizes understanding. Vaginal bleeding and discharge, contractions, and fetal movement reviewed by RN. Follow-up care reviewed. Pt discharged home with significant other.  

## 2022-01-19 ENCOUNTER — Encounter: Payer: Self-pay | Admitting: Obstetrics and Gynecology

## 2022-01-19 ENCOUNTER — Ambulatory Visit (INDEPENDENT_AMBULATORY_CARE_PROVIDER_SITE_OTHER): Payer: Medicaid Other | Admitting: Obstetrics and Gynecology

## 2022-01-19 VITALS — BP 120/82 | HR 96 | Wt 219.0 lb

## 2022-01-19 DIAGNOSIS — Z3480 Encounter for supervision of other normal pregnancy, unspecified trimester: Secondary | ICD-10-CM

## 2022-01-19 DIAGNOSIS — Z23 Encounter for immunization: Secondary | ICD-10-CM

## 2022-01-19 DIAGNOSIS — Z3A38 38 weeks gestation of pregnancy: Secondary | ICD-10-CM

## 2022-01-19 DIAGNOSIS — Z3483 Encounter for supervision of other normal pregnancy, third trimester: Secondary | ICD-10-CM

## 2022-01-19 NOTE — Progress Notes (Signed)
ROB.Patient states no questions or concerns at this time.  

## 2022-01-19 NOTE — Progress Notes (Signed)
ROB: Doing well today.  Was seen in triage 2 days ago due to decreased fetal movement and headache.  Today notes good movement today. Reviewed labor precautions, fetal kick counts.  For flu vaccine today. RTC in 1 week.

## 2022-01-26 ENCOUNTER — Encounter: Payer: Self-pay | Admitting: Advanced Practice Midwife

## 2022-01-26 ENCOUNTER — Ambulatory Visit (INDEPENDENT_AMBULATORY_CARE_PROVIDER_SITE_OTHER): Payer: Medicaid Other | Admitting: Advanced Practice Midwife

## 2022-01-26 VITALS — BP 112/57 | HR 106 | Wt 215.0 lb

## 2022-01-26 DIAGNOSIS — Z3A39 39 weeks gestation of pregnancy: Secondary | ICD-10-CM

## 2022-01-26 DIAGNOSIS — Z3483 Encounter for supervision of other normal pregnancy, third trimester: Secondary | ICD-10-CM

## 2022-01-26 NOTE — Progress Notes (Signed)
Routine Prenatal Care Visit  Subjective  Diana Kirby is a 31 y.o. [phone] at [redacted]w[redacted]d being seen today for ongoing prenatal care.  She is currently monitored for the following issues for this low-risk pregnancy and has Anxiety and depression; Supervision of other normal pregnancy, antepartum; Missed abortion; and Pregnancy on their problem list.  ----------------------------------------------------------------------------------- Patient reports general discomforts of third trimester as well as diarrhea. She feels ready for delivery.   Contractions: Irregular. Vag. Bleeding: None.  Movement: (!) Decreased. Leaking Fluid denies.  ----------------------------------------------------------------------------------- The following portions of the patient's history were reviewed and updated as appropriate: allergies, current medications, past family history, past medical history, past social history, past surgical history and problem list. Problem list updated.  Objective  Blood pressure (!) 112/57, pulse (!) 106, weight 215 lb (97.5 kg), last menstrual period 04/28/2021, unknown if currently breastfeeding. Pregravid weight 195 lb (88.5 kg) Total Weight Gain 20 lb (9.072 kg) Urinalysis: Urine Protein    Urine Glucose    Fetal Status: Fetal Heart Rate (bpm): 137 Fundal Height: 39 cm Movement: (!) Decreased  Presentation: Vertex  General:  Alert, oriented and cooperative. Patient is in no acute distress.  Skin: Skin is warm and dry. No rash noted.   Cardiovascular: Normal heart rate noted  Respiratory: Normal respiratory effort, no problems with respiration noted  Abdomen: Soft, gravid, appropriate for gestational age. Pain/Pressure: Present     Pelvic:  cervical sweep Dilation: 2.5 Effacement (%): 30 Station: -2  Extremities: Normal range of motion.  Edema: None  Mental Status: Normal mood and affect. Normal behavior. Normal judgment and thought content.   Assessment   31 y.o. [id number] at [redacted]w[redacted]d by   02/02/2022, by Last Menstrual Period presenting for routine prenatal visit  Plan   G6 Problems (from 07/03/21 to present)    No problems associated with this episode.       Term labor symptoms and general obstetric precautions including but not limited to vaginal bleeding, contractions, leaking of fluid and fetal movement were reviewed in detail with the patient. Please refer to After Visit Summary for other counseling recommendations.   Return in about 1 week (around 02/02/2022) for rob.  Rod Can, CNM 01/26/2022 2:34 PM

## 2022-01-28 ENCOUNTER — Inpatient Hospital Stay: Payer: Medicaid Other | Admitting: Anesthesiology

## 2022-01-28 ENCOUNTER — Encounter: Payer: Self-pay | Admitting: Obstetrics and Gynecology

## 2022-01-28 ENCOUNTER — Other Ambulatory Visit: Payer: Self-pay

## 2022-01-28 ENCOUNTER — Inpatient Hospital Stay
Admission: EM | Admit: 2022-01-28 | Discharge: 2022-01-30 | DRG: 806 | Disposition: A | Discharge status: Home / Self Care | Payer: Medicaid Other | Attending: Certified Nurse Midwife | Admitting: Certified Nurse Midwife

## 2022-01-28 DIAGNOSIS — D62 Acute posthemorrhagic anemia: Secondary | ICD-10-CM | POA: Diagnosis not present

## 2022-01-28 DIAGNOSIS — O26893 Other specified pregnancy related conditions, third trimester: Secondary | ICD-10-CM | POA: Diagnosis present

## 2022-01-28 DIAGNOSIS — Z23 Encounter for immunization: Secondary | ICD-10-CM | POA: Diagnosis not present

## 2022-01-28 DIAGNOSIS — O9903 Anemia complicating the puerperium: Secondary | ICD-10-CM | POA: Diagnosis not present

## 2022-01-28 DIAGNOSIS — O99214 Obesity complicating childbirth: Secondary | ICD-10-CM | POA: Diagnosis present

## 2022-01-28 DIAGNOSIS — O99824 Streptococcus B carrier state complicating childbirth: Secondary | ICD-10-CM | POA: Diagnosis present

## 2022-01-28 DIAGNOSIS — Z3A39 39 weeks gestation of pregnancy: Secondary | ICD-10-CM

## 2022-01-28 DIAGNOSIS — O9081 Anemia of the puerperium: Secondary | ICD-10-CM | POA: Diagnosis not present

## 2022-01-28 LAB — CBC
HCT: 33.3 % — ABNORMAL LOW (ref 36.0–46.0)
Hemoglobin: 10.8 g/dL — ABNORMAL LOW (ref 12.0–15.0)
MCH: 28.1 pg (ref 26.0–34.0)
MCHC: 32.4 g/dL (ref 30.0–36.0)
MCV: 86.5 fL (ref 80.0–100.0)
Platelets: 169 10*3/uL (ref 150–400)
RBC: 3.85 MIL/uL — ABNORMAL LOW (ref 3.87–5.11)
RDW: 13.5 % (ref 11.5–15.5)
WBC: 6.4 10*3/uL (ref 4.0–10.5)
nRBC: 0 % (ref 0.0–0.2)

## 2022-01-28 LAB — TYPE AND SCREEN
ABO/RH(D): B POS
Antibody Screen: NEGATIVE

## 2022-01-28 LAB — CREATININE, SERUM
Creatinine, Ser: 0.4 mg/dL — ABNORMAL LOW (ref 0.44–1.00)
GFR, Estimated: >60 mL/min (ref >60)

## 2022-01-28 MED ORDER — OXYTOCIN-SODIUM CHLORIDE 30-0.9 UT/500ML-% IV SOLN
INTRAVENOUS | Status: AC
  Administered 2022-01-28: 2 m[IU]/min via INTRAVENOUS
  Filled 2022-01-28: qty 500

## 2022-01-28 MED ORDER — METHYLERGONOVINE MALEATE 0.2 MG/ML IJ SOLN: 0.2000 mg | INTRAMUSCULAR | Status: DC | PRN

## 2022-01-28 MED ORDER — WITCH HAZEL-GLYCERIN EX PADS
1.0000 | MEDICATED_PAD | CUTANEOUS | Status: DC | PRN
  Administered 2022-01-30: 1 via TOPICAL
  Filled 2022-01-28 (×2): qty 100

## 2022-01-28 MED ORDER — AMMONIA AROMATIC IN INHA
RESPIRATORY_TRACT | Status: AC
  Filled 2022-01-28: qty 10

## 2022-01-28 MED ORDER — LACTATED RINGERS IV SOLN
500.0000 mL | Freq: Once | INTRAVENOUS | Status: AC
  Administered 2022-01-28: 500 mL via INTRAVENOUS

## 2022-01-28 MED ORDER — EPHEDRINE 5 MG/ML INJ: 10.0000 mg | INTRAVENOUS | Status: DC | PRN

## 2022-01-28 MED ORDER — ACETAMINOPHEN 325 MG PO TABS
650.0000 mg | ORAL_TABLET | ORAL | Status: DC | PRN
  Administered 2022-01-29 (×2): 650 mg via ORAL
  Filled 2022-01-28 (×2): qty 2

## 2022-01-28 MED ORDER — OXYCODONE-ACETAMINOPHEN 5-325 MG PO TABS: 2.0000 | ORAL_TABLET | ORAL | Status: DC | PRN

## 2022-01-28 MED ORDER — BENZOCAINE-MENTHOL 20-0.5 % EX AERO
1.0000 | INHALATION_SPRAY | CUTANEOUS | Status: DC | PRN
  Administered 2022-01-30: 1 via TOPICAL
  Filled 2022-01-28 (×2): qty 56

## 2022-01-28 MED ORDER — OXYTOCIN-SODIUM CHLORIDE 30-0.9 UT/500ML-% IV SOLN
1.0000 m[IU]/min | INTRAVENOUS | Status: DC
  Filled 2022-01-28: qty 500

## 2022-01-28 MED ORDER — VARICELLA VIRUS VACCINE LIVE 1350 PFU/0.5ML IJ SUSR
0.5000 mL | Freq: Once | INTRAMUSCULAR | Status: AC
  Administered 2022-01-30: 0.5 mL via SUBCUTANEOUS
  Filled 2022-01-28: qty 0.5

## 2022-01-28 MED ORDER — SOD CITRATE-CITRIC ACID 500-334 MG/5ML PO SOLN: 30.0000 mL | ORAL | Status: DC | PRN

## 2022-01-28 MED ORDER — LIDOCAINE-EPINEPHRINE (PF) 1.5 %-1:200000 IJ SOLN
INTRAMUSCULAR | Status: DC | PRN
  Administered 2022-01-28: 3 mL via EPIDURAL

## 2022-01-28 MED ORDER — SENNOSIDES-DOCUSATE SODIUM 8.6-50 MG PO TABS
2.0000 | ORAL_TABLET | ORAL | Status: DC
  Administered 2022-01-28: 2 via ORAL
  Filled 2022-01-28 (×2): qty 2

## 2022-01-28 MED ORDER — BUPIVACAINE HCL (PF) 0.25 % IJ SOLN
INTRAMUSCULAR | Status: DC | PRN
  Administered 2022-01-28: 8 mL via EPIDURAL

## 2022-01-28 MED ORDER — MISOPROSTOL 200 MCG PO TABS
ORAL_TABLET | ORAL | Status: AC
  Filled 2022-01-28: qty 4

## 2022-01-28 MED ORDER — PHENYLEPHRINE 80 MCG/ML (10ML) SYRINGE FOR IV PUSH (FOR BLOOD PRESSURE SUPPORT): 80.0000 ug | PREFILLED_SYRINGE | INTRAVENOUS | Status: DC | PRN

## 2022-01-28 MED ORDER — LACTATED RINGERS IV SOLN: INTRAVENOUS | Status: DC

## 2022-01-28 MED ORDER — FENTANYL-BUPIVACAINE-NACL 0.5-0.125-0.9 MG/250ML-% EP SOLN
EPIDURAL | Status: AC
  Filled 2022-01-28: qty 250

## 2022-01-28 MED ORDER — OXYTOCIN-SODIUM CHLORIDE 30-0.9 UT/500ML-% IV SOLN
2.5000 [IU]/h | INTRAVENOUS | Status: DC
  Administered 2022-01-28: 2.5 [IU]/h via INTRAVENOUS

## 2022-01-28 MED ORDER — METHYLERGONOVINE MALEATE 0.2 MG PO TABS: 0.2000 mg | ORAL_TABLET | ORAL | Status: DC | PRN

## 2022-01-28 MED ORDER — FENTANYL CITRATE (PF) 100 MCG/2ML IJ SOLN: 50.0000 ug | INTRAMUSCULAR | Status: DC | PRN

## 2022-01-28 MED ORDER — OXYTOCIN 10 UNIT/ML IJ SOLN
INTRAMUSCULAR | Status: AC
  Filled 2022-01-28: qty 2

## 2022-01-28 MED ORDER — OXYTOCIN-SODIUM CHLORIDE 30-0.9 UT/500ML-% IV SOLN: 1.0000 m[IU]/min | INTRAVENOUS | Status: DC

## 2022-01-28 MED ORDER — VANCOMYCIN HCL 10 G IV SOLR
2000.0000 mg | Freq: Three times a day (TID) | INTRAVENOUS | Status: DC
  Administered 2022-01-28: 2000 mg via INTRAVENOUS
  Filled 2022-01-28: qty 20
  Filled 2022-01-28: qty 2000
  Filled 2022-01-28: qty 20

## 2022-01-28 MED ORDER — LIDOCAINE HCL (PF) 1 % IJ SOLN
INTRAMUSCULAR | Status: AC
  Filled 2022-01-28: qty 30

## 2022-01-28 MED ORDER — ONDANSETRON HCL 4 MG/2ML IJ SOLN: 4.0000 mg | INTRAMUSCULAR | Status: DC | PRN

## 2022-01-28 MED ORDER — TERBUTALINE SULFATE 1 MG/ML IJ SOLN: 0.2500 mg | Freq: Once | INTRAMUSCULAR | Status: DC | PRN

## 2022-01-28 MED ORDER — IBUPROFEN 600 MG PO TABS
600.0000 mg | ORAL_TABLET | Freq: Four times a day (QID) | ORAL | Status: DC
  Administered 2022-01-28 – 2022-01-30 (×8): 600 mg via ORAL
  Filled 2022-01-28 (×8): qty 1

## 2022-01-28 MED ORDER — SIMETHICONE 80 MG PO CHEW: 80.0000 mg | CHEWABLE_TABLET | ORAL | Status: DC | PRN

## 2022-01-28 MED ORDER — PRENATAL MULTIVITAMIN CH
1.0000 | ORAL_TABLET | Freq: Every day | ORAL | Status: DC
  Administered 2022-01-29 – 2022-01-30 (×2): 1 via ORAL
  Filled 2022-01-28 (×2): qty 1

## 2022-01-28 MED ORDER — OXYCODONE-ACETAMINOPHEN 5-325 MG PO TABS
1.0000 | ORAL_TABLET | ORAL | Status: DC | PRN
  Administered 2022-01-29: 1 via ORAL
  Filled 2022-01-28: qty 1

## 2022-01-28 MED ORDER — ONDANSETRON HCL 4 MG PO TABS: 4.0000 mg | ORAL_TABLET | ORAL | Status: DC | PRN

## 2022-01-28 MED ORDER — DIBUCAINE (PERIANAL) 1 % EX OINT
1.0000 | TOPICAL_OINTMENT | CUTANEOUS | Status: DC | PRN
  Administered 2022-01-30: 1 via RECTAL
  Filled 2022-01-28 (×2): qty 28

## 2022-01-28 MED ORDER — DIPHENHYDRAMINE HCL 50 MG/ML IJ SOLN
12.5000 mg | INTRAMUSCULAR | Status: DC | PRN
  Administered 2022-01-28: 12.5 mg via INTRAVENOUS
  Filled 2022-01-28: qty 1

## 2022-01-28 MED ORDER — OXYTOCIN BOLUS FROM INFUSION: 333.0000 mL | Freq: Once | INTRAVENOUS | Status: DC

## 2022-01-28 MED ORDER — ONDANSETRON HCL 4 MG/2ML IJ SOLN: 4.0000 mg | Freq: Four times a day (QID) | INTRAMUSCULAR | Status: DC | PRN

## 2022-01-28 MED ORDER — MEASLES, MUMPS & RUBELLA VAC IJ SOLR: 0.5000 mL | Freq: Once | INTRAMUSCULAR | Status: DC

## 2022-01-28 MED ORDER — DOCUSATE SODIUM 100 MG PO CAPS
100.0000 mg | ORAL_CAPSULE | Freq: Two times a day (BID) | ORAL | Status: DC
  Administered 2022-01-29 – 2022-01-30 (×3): 100 mg via ORAL
  Filled 2022-01-28 (×3): qty 1

## 2022-01-28 MED ORDER — FERROUS SULFATE 325 (65 FE) MG PO TABS
325.0000 mg | ORAL_TABLET | Freq: Every day | ORAL | Status: DC
  Administered 2022-01-29 – 2022-01-30 (×2): 325 mg via ORAL
  Filled 2022-01-28 (×2): qty 1

## 2022-01-28 MED ORDER — FENTANYL-BUPIVACAINE-NACL 0.5-0.125-0.9 MG/250ML-% EP SOLN
12.0000 mL/h | EPIDURAL | Status: DC | PRN
  Administered 2022-01-28: 12 mL/h via EPIDURAL

## 2022-01-28 MED ORDER — COCONUT OIL OIL: 1.0000 | TOPICAL_OIL | Status: DC | PRN

## 2022-01-28 MED ORDER — LIDOCAINE HCL (PF) 1 % IJ SOLN: 30.0000 mL | INTRAMUSCULAR | Status: DC | PRN

## 2022-01-28 MED ORDER — LACTATED RINGERS IV SOLN: 500.0000 mL | INTRAVENOUS | Status: DC | PRN

## 2022-01-28 NOTE — Progress Notes (Signed)
LABOR NOTE   Diana Kirby 31 y.o.GP@ at [redacted]w[redacted]d  SUBJECTIVE:  More uncomfortable with contractions, considering epidural  Analgesia: Labor support without medications  OBJECTIVE:  BP 130/76 (BP Location: Left Arm)   Pulse 95   Temp 98.4 F (36.9 C) (Oral)   Resp 18   Ht 5\' 3"  (1.6 m)   Wt 98 kg   LMP 04/28/2021 (Exact Date)   BMI 38.26 kg/m  No intake/output data recorded.  She has shown cervical change. CERVIX: 5.5 cm :  70%:   -2:   mid position:   soft SVE:   Dilation: 5.5 Effacement (%): 70 Station: -2 Exam by:: Philip Aspen CNM CONTRACTIONS: regular, every 5-6 minutes FHR: Fetal heart tracing reviewed. Baseline: 120 bpm, Variability: Good {> 6 bpm), Accelerations: Reactive, and Decelerations: Absent Category I    Labs: Lab Results  Component Value Date   WBC 6.4 01/28/2022   HGB 10.8 (L) 01/28/2022   HCT 33.3 (L) 01/28/2022   MCV 86.5 01/28/2022   PLT 169 01/28/2022    ASSESSMENT: 1) Labor curve reviewed.       Progress: latent labor     Membranes: intact            Principal Problem:   Labor and delivery, indication for care   PLAN: Discussed using pitocin to increase contraction rate as she has made some change but it has been small amount in the last 3 hours. She is in agreement. She is considering epidural , discussed pain medication options with pt. She will wait and see how she feels with pitocin.   Philip Aspen, CNM  01/28/2022 12:46 PM

## 2022-01-28 NOTE — H&P (Signed)
History and Physical   HPI  Diana Kirby is a 31 y.o. [id number] at [redacted]w[redacted]d Estimated Date of Delivery: 02/02/22 who is being admitted for labor management.   OB History  OB History  Gravida Para Term Preterm AB Living  6 3 3  0 2 3  SAB IAB Ectopic Multiple Live Births  2 0 0 0 3    # Outcome Date GA Lbr Len/2nd Weight Sex Delivery Anes PTL Lv  6 Current           5 Term 02/09/20 [redacted]w[redacted]d 25:14 / 00:35 3900 g F Vag-Spont EPI  LIV     Name: Diana Kirby     Apgar1: 8  Apgar5: 9  4 Term 07/13/11 [redacted]w[redacted]d  3345 g F Vag-Spont  N LIV  3 Term 12/13/09 [redacted]w[redacted]d  3799 g M Vag-Spont EPI N LIV  2 SAB           1 SAB             PROBLEM LIST  Pregnancy complications or risks: Patient Active Problem List   Diagnosis Date Noted   Labor and delivery, indication for care 01/28/2022   Pregnancy 01/17/2022   Missed abortion    Supervision of other normal pregnancy, antepartum 08/05/2020   Anxiety and depression 10/21/2019    Prenatal labs and studies: ABO, Rh: B/Positive/-- (03/27 1604) Antibody: Negative (03/27 1604) Rubella: 1.41 (03/27 1604) RPR: Non Reactive (08/14 1107)  HBsAg: Negative (03/27 1604)  HIV: Non Reactive (03/27 1604)  04-24-1987-- (10/04 1158)   Past Medical History:  Diagnosis Date   Anxiety    Miscarriage    No pertinent past medical history      Past Surgical History:  Procedure Laterality Date   DILATION AND CURETTAGE OF UTERUS     DILATION AND EVACUATION N/A 09/06/2020   Procedure: DILATATION AND EVACUATION;  Surgeon: 09/08/2020, MD;  Location: [facility] ORS;  Service: Gynecology;  Laterality: N/A;   NO PAST SURGERIES     no surgical history       Medications    Current Discharge Medication List     CONTINUE these medications which have NOT CHANGED   Details  acetaminophen (TYLENOL) 500 MG tablet Take 2 tablets (1,000 mg total) by mouth every 6 (six) hours as needed for moderate pain. Qty: 30 tablet, Refills: 0    Prenatal Vit-Fe  Fumarate-FA (PRENATAL PLUS VITAMIN/MINERAL) 27-1 MG TABS Take 1 tablet by mouth daily. Qty: 60 tablet, Refills: 4         Allergies  Penicillins  Review of Systems  Constitutional: negative Eyes: negative Ears, nose, mouth, throat, and face: negative Respiratory: negative Cardiovascular: negative Gastrointestinal: negative Genitourinary:negative Integument/breast: negative Hematologic/lymphatic: negative Musculoskeletal:negative Neurological: negative Behavioral/Psych: negative Endocrine: negative Allergic/Immunologic: negative  Physical Exam  BP 130/76 (BP Location: Left Arm)   Pulse 95   Temp 98.6 F (37 C) (Oral)   Resp 18   Ht 5\' 3"  (1.6 m)   Wt 98 kg   LMP 04/28/2021 (Exact Date)   BMI 38.26 kg/m   Lungs:  CTA B Cardio: RRR without M/R/G Abd: Soft, gravid, NT Presentation: cephalic EXT: No C/C/ 1+ Edema DTRs: 2+ B CERVIX: Dilation: 4.5 Effacement (%): 70 Cervical Position: Posterior Station: -2 Exam by:: 04/30/2021 CNM  See Prenatal records for more detailed PE.     FHR:  Baseline: 125 bpm, Variability: Good {> 6 bpm), Accelerations: Reactive, and Decelerations: Absent  Toco: Uterine Contractions: Frequency: Every 5-6 minutes  Test  Results  Results for orders placed or performed during the hospital encounter of 01/28/22 (from the past 24 hour(s))  CBC     Status: Abnormal   Collection Time: 01/28/22  9:28 AM  Result Value Ref Range   WBC 6.4 4.0 - 10.5 K/uL   RBC 3.85 (L) 3.87 - 5.11 MIL/uL   Hemoglobin 10.8 (L) 12.0 - 15.0 g/dL   HCT 33.3 (L) 36.0 - 46.0 %   MCV 86.5 80.0 - 100.0 fL   MCH 28.1 26.0 - 34.0 pg   MCHC 32.4 30.0 - 36.0 g/dL   RDW 13.5 11.5 - 15.5 %   Platelets 169 150 - 400 K/uL   nRBC 0.0 0.0 - 0.2 %   Group B Strep positive  Assessment   [id number] at [redacted]w[redacted]d Estimated Date of Delivery: 02/02/22  The fetus is reassuring.   Patient Active Problem List   Diagnosis Date Noted   Labor and delivery, indication for  care 01/28/2022   Pregnancy 01/17/2022   Missed abortion    Supervision of other normal pregnancy, antepartum 08/05/2020   Anxiety and depression 10/21/2019    Plan  1. Admit to L&D :   2. EFM:-- Category 1 3. IV pain medication , Nitrous, or Epidural if desired.   4. Admission labs  5. Dr/ Marcelline Mates notified of admission 6.Anticipate NSVD  Philip Aspen, CNM  01/28/2022 9:53 AM

## 2022-01-28 NOTE — Discharge Summary (Signed)
Postpartum Discharge Summary  Date of Service updated 01/30/2022     Patient Name: Diana Kirby DOB: 05-Aug-1990 MRN: [id number]  Date of admission: 01/28/2022 Delivery date:01/28/2022  Delivering provider: Doreene Burke  Date of discharge: 01/30/2022  Admitting diagnosis: Labor and delivery, indication for care [O75.9] Intrauterine pregnancy: [redacted]w[redacted]d     Secondary diagnosis:  Principal Problem:   Labor and delivery, indication for care  Additional problems: none    Discharge diagnosis: Term Pregnancy Delivered                                              Post partum procedures: none Augmentation: Pitocin Complications: None  Hospital course: Onset of Labor With Vaginal Delivery      31 y.o. yo [id number] at [redacted]w[redacted]d was admitted in Latent Labor on 01/28/2022. Labor course was uncomplicated.  Membrane Rupture Time/Date: 2:20 PM ,01/28/2022   Delivery Method:Vaginal, Spontaneous  Episiotomy: None  Lacerations:  None  Patient had an uneventful postpartum course.  She is ambulating, tolerating a regular diet, passing flatus, and urinating well. Patient is discharged home in stable condition on 01/30/22.  Newborn Data: Birth date:01/28/2022  Birth time:3:22 PM  Gender:Female  Living status:Living  Apgars:9 ,9  Weight:3790 g   Magnesium Sulfate received: No BMZ received: No Rhophylac:N/A MMR:No T-DaP:Given prenatally Flu: No Transfusion:No  Physical exam  Vitals:   01/29/22 0807 01/29/22 1533 01/29/22 2314 01/30/22 0826  BP: 113/73 109/76 110/75 106/76  Pulse: 81 81 72 72  Resp: 20 20 18 16   Temp: 98.5 F (36.9 C) 98.6 F (37 C) 97.7 F (36.5 C) (!) 97.5 F (36.4 C)  TempSrc: Oral Oral Oral Oral  SpO2: 100%  99% 100%  Weight:      Height:       General: alert, cooperative, and no distress Lochia: appropriate Uterine Fundus: firm Incision: N/A DVT Evaluation: No evidence of DVT seen on physical exam. Negative Homan's sign. No cords or calf  tenderness. Labs: Lab Results  Component Value Date   WBC 8.5 01/29/2022   HGB 9.5 (L) 01/29/2022   HCT 29.5 (L) 01/29/2022   MCV 87.3 01/29/2022   PLT 145 (L) 01/29/2022      Latest Ref Rng & Units 01/28/2022    9:28 AM  CMP  Creatinine 0.44 - 1.00 mg/dL 0.34    Edinburgh Score:    01/29/2022   12:00 AM  Edinburgh Postnatal Depression Scale Screening Tool  I have been able to laugh and see the funny side of things. 0  I have looked forward with enjoyment to things. 0  I have blamed myself unnecessarily when things went wrong. 0  I have been anxious or worried for no good reason. 2  I have felt scared or panicky for no good reason. 2  Things have been getting on top of me. 1  I have been so unhappy that I have had difficulty sleeping. 0  I have felt sad or miserable. 0  I have been so unhappy that I have been crying. 0  The thought of harming myself has occurred to me. 0  Edinburgh Postnatal Depression Scale Total 5      After visit meds:  Allergies as of 01/30/2022       Reactions   Penicillins Hives        Medication List     TAKE  these medications    acetaminophen 500 MG tablet Commonly known as: TYLENOL Take 2 tablets (1,000 mg total) by mouth every 6 (six) hours as needed for moderate pain.   ibuprofen 600 MG tablet Commonly known as: ADVIL Take 1 tablet (600 mg total) by mouth every 6 (six) hours.   Prenatal Plus Vitamin/Mineral 27-1 MG Tabs Take 1 tablet by mouth daily.         Discharge home in stable condition Infant Feeding: Bottle Infant Disposition:home with mother Discharge instruction: per After Visit Summary and Postpartum booklet. Activity: Advance as tolerated. Pelvic rest for 6 weeks.  Diet: routine diet Anticipated Birth Control: IUD, Mirena 6 wks ppv Postpartum Appointment:2 weeks, video visit with Doreene Burke, CNM  Additional Postpartum F/U:  6 weeks PPV in the office with Doreene Burke, CNM  Future  Appointments: Future Appointments  Date Time Provider Department Center  02/02/2022 10:55 AM Mirna Mires, CNM [facility] None   Follow up Visit:  Follow-up Information     Doreene Burke, CNM. Schedule an appointment as soon as possible for a visit.   Specialties: Certified Nurse Midwife, Radiology Why: Please call the office at [phone] and make a 2 week post delivery virtual appointment, and then another appointement for 6 weeks post delivery. Tell the scheduler that you plan on a Mirena IUD. Contact information: [address] [place] [address] [phone]                     01/30/2022 Mirna Mires, CNM

## 2022-01-28 NOTE — Anesthesia Preprocedure Evaluation (Addendum)
Anesthesia Evaluation  Patient identified by MRN, date of birth, ID band Patient awake    Reviewed: Allergy & Precautions, NPO status , Patient's Chart, lab work & pertinent test results  History of Anesthesia Complications Negative for: history of anesthetic complications  Airway Mallampati: II   Neck ROM: Full    Dental no notable dental hx.    Pulmonary neg pulmonary ROS,    Pulmonary exam normal breath sounds clear to auscultation       Cardiovascular Exercise Tolerance: Good negative cardio ROS Normal cardiovascular exam Rhythm:Regular Rate:Normal     Neuro/Psych PSYCHIATRIC DISORDERS Anxiety Depression negative neurological ROS     GI/Hepatic negative GI ROS,   Endo/Other  Obesity   Renal/GU negative Renal ROS     Musculoskeletal   Abdominal   Peds  Hematology negative hematology ROS (+)   Anesthesia Other Findings 31 yo [id number] at 41 2/7 requesting labor epidural.  Reproductive/Obstetrics                            Anesthesia Physical Anesthesia Plan  ASA: 2  Anesthesia Plan: Epidural   Post-op Pain Management:    Induction:   PONV Risk Score and Plan: 2  Airway Management Planned: Natural Airway  Additional Equipment:   Intra-op Plan:   Post-operative Plan:   Informed Consent: I have reviewed the patients History and Physical, chart, labs and discussed the procedure including the risks, benefits and alternatives for the proposed anesthesia with the patient or authorized representative who has indicated his/her understanding and acceptance.     Dental Advisory Given  Plan Discussed with:   Anesthesia Plan Comments: (Patient reports no bleeding problems and no anticoagulant use.   Patient consented for risks of anesthesia including but not limited to:  - adverse reactions to medications - risk of bleeding, infection and or nerve damage from epidural that  could lead to paralysis - risk of headache or failed epidural - nerve damage due to positioning - that if epidural is used for C-section that there is a chance of epidural failure requiring spinal placement or conversion to GA - Damage to heart, brain, lungs, other parts of body or loss of life  Patient voiced understanding.)        Anesthesia Quick Evaluation

## 2022-01-28 NOTE — Progress Notes (Signed)
LABOR NOTE   Diana Kirby 31 y.o.GP@ at [redacted]w[redacted]d  SUBJECTIVE:  Comfortable with epidural  Analgesia: Epidural  OBJECTIVE:  BP 113/66   Pulse 92   Temp 98.4 F (36.9 C) (Oral)   Resp 18   Ht 5\' 3"  (1.6 m)   Wt 98 kg   LMP 04/28/2021 (Exact Date)   SpO2 99%   BMI 38.26 kg/m  No intake/output data recorded.  She has shown cervical change. CERVIX: 7cm:  80 %:   -2:   mid position:   soft SVE:   Dilation: 5.5 Effacement (%): 80 Station: -2 Exam by:: Philip Aspen CNM CONTRACTIONS: regular, every 2-4 minutes FHR: Fetal heart tracing reviewed. Baseline: 115 bpm, Variability: Good {> 6 bpm), Accelerations: Reactive, and Decelerations: Absent Category I    Labs: Lab Results  Component Value Date   WBC 6.4 01/28/2022   HGB 10.8 (L) 01/28/2022   HCT 33.3 (L) 01/28/2022   MCV 86.5 01/28/2022   PLT 169 01/28/2022    ASSESSMENT: 1) Labor curve reviewed.       Progress: Active phase labor.     Membranes: ruptured, clear fluid     Bloody show      Principal Problem:   Labor and delivery, indication for care   PLAN: continue present management   Philip Aspen, CNM  01/28/2022 2:30 PM

## 2022-01-28 NOTE — Consult Note (Signed)
ANTIBIOTIC CONSULT NOTE  Pharmacy Consult for Vancomycin Indication: GBS Prophylaxis   Allergies  Allergen Reactions   Penicillins Hives    Patient Measurements: Height: 5\' 3"  (160 cm) Weight: 98 kg (216 lb) IBW/kg (Calculated) : 52.4  Vital Signs: Temp: 98.6 F (37 C) (10/22 0812) Temp Source: Oral (10/22 0812) BP: 130/76 (10/22 0812) Pulse Rate: 95 (10/22 0812)  Labs: Recent Labs    01/28/22 0928  WBC 6.4  HGB 10.8*  PLT 169  CREATININE 0.40*   No results for input(s): "VANCOTROUGH", "VANCOPEAK", "VANCORANDOM" in the last 72 hours.   Microbiology: Recent Results (from the past 720 hour(s))  Strep Gp B Culture+Rflx     Status: Abnormal   Collection Time: 01/10/22 11:58 AM   Specimen: Vaginal/Rectal; Vaginal Fluid   VR  Result Value Ref Range Status   Strep Gp B Culture+Rflx Positive (A) Negative Final    Comment: Centers for Disease Control and Prevention (CDC) and American Congress of Obstetricians and Gynecologists (ACOG) guidelines for prevention of perinatal group B streptococcal (GBS) disease specify co-collection of a vaginal and rectal swab specimen to maximize sensitivity of GBS detection. Per the CDC and ACOG, swabbing both the lower vagina and rectum substantially increases the yield of detection compared with sampling the vagina alone. Penicillin G, ampicillin, or cefazolin are indicated for intrapartum prophylaxis of perinatal GBS colonization. Reflex susceptibility testing should be performed prior to use of clindamycin only on GBS isolates from penicillin-allergic women who are considered a high risk for anaphylaxis. Treatment with vancomycin without additional testing is warranted if resistance to clindamycin is noted.   Strep Gp B Susceptibility     Status: Abnormal   Collection Time: 01/10/22 11:58 AM   VR  Result Value Ref Range Status   Organism Identification Comment  Final    Comment: Beta hemolytic Streptococcus, group B    Clindamycin Resistant (A)  Final    Comment: Testing performed by disk diffusion. Testing for inducible clindamycin resistance was performed using erythromycin and clindamycin in the D-zone test. Per the Centers for Disease Control and Prevention (CDC), erythromycin is no longer an acceptable alternative for intrapartum group B Streptococcus (GBS) prophylaxis for penicillin-allergic women at high risk for anaphylaxis.   Urine Culture     Status: None   Collection Time: 01/10/22 12:02 PM   Specimen: Urine   UR  Result Value Ref Range Status   Urine Culture, Routine Final report  Final   Organism ID, Bacteria Comment  Final    Comment: Mixed urogenital flora 10,000-25,000 colony forming units per mL     Medications:  Scheduled:   ammonia       lidocaine (PF)       misoprostol       oxytocin       oxytocin 40 units in LR 1000 mL  333 mL Intravenous Once   Oxytocin-Sodium Chloride       Infusions:   lactated ringers     lactated ringers 125 mL/hr at 01/28/22 01/30/22   oxytocin     vancomycin 2,000 mg (01/28/22 1015)   PRN: acetaminophen, ammonia, fentaNYL (SUBLIMAZE) injection, lactated ringers, lidocaine (PF), lidocaine (PF), misoprostol, ondansetron, oxytocin, Oxytocin-Sodium Chloride, sodium citrate-citric acid Anti-infectives (From admission, onward)    Start     Dose/Rate Route Frequency Ordered Stop   01/28/22 1000  vancomycin (VANCOCIN) 2,000 mg in sodium chloride 0.9 % 500 mL IVPB        2,000 mg 260 mL/hr over 120 Minutes Intravenous  Every 8 hours 01/28/22 0842 02/01/22 0959       Assessment: Diana Kirby is a 31 y.o. female [id number] at [redacted]w[redacted]d being admitted for labor management. Pharmacy has been consulted to manage Vancomycin for positive GBS screen.  Goal of Therapy:  Vancomycin Trough 15-20 mg/L  Plan:  Initiate Vancomycin 2000 mg IV Q8H Check Scr with next labs if vancomycin continued beyond 48 hours. Check vancomycin trough level prior to 4th or 5th  dose.   Thank you for allowing pharmacy to be a part of this patient's care.  Gretel Acre, PharmD PGY1 Pharmacy Resident 01/28/2022 11:45 AM

## 2022-01-28 NOTE — Anesthesia Procedure Notes (Signed)
Epidural Patient location during procedure: OB Start time: 01/28/2022 1:11 PM End time: 01/28/2022 1:20 PM  Staffing Anesthesiologist: Darrin Nipper, MD Performed: anesthesiologist   Preanesthetic Checklist Completed: patient identified, IV checked, risks and benefits discussed, surgical consent, monitors and equipment checked, pre-op evaluation and timeout performed  Epidural Patient position: sitting Prep: Betadine Patient monitoring: heart rate, continuous pulse ox and blood pressure Approach: midline Location: L3-L4 Injection technique: LOR air  Needle:  Needle type: Tuohy  Needle gauge: 17 G Needle length: 9 cm Needle insertion depth: 6 cm Catheter at skin depth: 11 cm Test dose: negative and 1.5% lidocaine with Epi 1:200 K  Assessment Sensory level: T4  Additional Notes Straightforward placement without apparent complications.Reason for block:procedure for pain

## 2022-01-29 LAB — CBC
HCT: 29.5 % — ABNORMAL LOW (ref 36.0–46.0)
Hemoglobin: 9.5 g/dL — ABNORMAL LOW (ref 12.0–15.0)
MCH: 28.1 pg (ref 26.0–34.0)
MCHC: 32.2 g/dL (ref 30.0–36.0)
MCV: 87.3 fL (ref 80.0–100.0)
Platelets: 145 10*3/uL — ABNORMAL LOW (ref 150–400)
RBC: 3.38 MIL/uL — ABNORMAL LOW (ref 3.87–5.11)
RDW: 13.6 % (ref 11.5–15.5)
WBC: 8.5 10*3/uL (ref 4.0–10.5)
nRBC: 0 % (ref 0.0–0.2)

## 2022-01-29 LAB — RPR: RPR Ser Ql: NONREACTIVE

## 2022-01-29 NOTE — Plan of Care (Signed)
Pt in bed in low locked position, with call bell in reach. Spouse at the bedside. Baby in bassinet sleeping. Bleeding well controlled. Uterus firm and at U. Plan for the day gone over with patient. Will continue to monitor.

## 2022-01-29 NOTE — Anesthesia Postprocedure Evaluation (Signed)
Anesthesia Post Note  Patient: Diana Kirby  Procedure(s) Performed: AN AD HOC LABOR EPIDURAL  Patient location during evaluation: Mother Baby Anesthesia Type: Epidural Level of consciousness: awake and alert Pain management: pain level controlled Vital Signs Assessment: post-procedure vital signs reviewed and stable Respiratory status: spontaneous breathing, nonlabored ventilation and respiratory function stable Cardiovascular status: stable Postop Assessment: no headache, no backache and epidural receding Anesthetic complications: no   No notable events documented.   Last Vitals:  Vitals:   01/29/22 0313 01/29/22 0807  BP: 117/68 113/73  Pulse: 83 81  Resp: 18 20  Temp: 37.1 C 36.9 C  SpO2: 99% 100%    Last Pain:  Vitals:   01/29/22 0807  TempSrc: Oral  PainSc:                  Jerrye Noble

## 2022-01-29 NOTE — Progress Notes (Signed)
Subjective:  Doing well postpartum day 1; she is tolerating regular diet, her pain is controlled with PO medication, she is ambulating and voiding without difficulty.  Objective:  Vital signs in last 24 hours: Temp:  [98.4 F (36.9 C)-98.9 F (37.2 C)] 98.5 F (36.9 C) (10/23 0807) Pulse Rate:  [61-114] 81 (10/23 0807) Resp:  [16-20] 20 (10/23 0807) BP: (102-184)/(52-160) 113/73 (10/23 0807) SpO2:  [99 %-100 %] 100 % (10/23 0807)    General: NAD Pulmonary: no increased work of breathing Abdomen: non-distended, non-tender, fundus firm at level of umbilicus Extremities: no edema, no erythema, no tenderness  Results for orders placed or performed during the hospital encounter of 01/28/22 (from the past 72 hour(s))  CBC     Status: Abnormal   Collection Time: 01/28/22  9:28 AM  Result Value Ref Range   WBC 6.4 4.0 - 10.5 K/uL   RBC 3.85 (L) 3.87 - 5.11 MIL/uL   Hemoglobin 10.8 (L) 12.0 - 15.0 g/dL   HCT 33.3 (L) 36.0 - 46.0 %   MCV 86.5 80.0 - 100.0 fL   MCH 28.1 26.0 - 34.0 pg   MCHC 32.4 30.0 - 36.0 g/dL   RDW 13.5 11.5 - 15.5 %   Platelets 169 150 - 400 K/uL   nRBC 0.0 0.0 - 0.2 %    Comment: Performed at [facility], [address] [place], [place] [address]  Type and screen Britt     Status: None   Collection Time: 01/28/22  9:28 AM  Result Value Ref Range   ABO/RH(D) B POS    Antibody Screen NEG    Sample Expiration      01/31/2022,2359 Performed at [facility], [address] [place], [place] [address]   RPR     Status: None   Collection Time: 01/28/22  9:28 AM  Result Value Ref Range   RPR Ser Ql NON REACTIVE NON REACTIVE    Comment: Performed at [facility], [address], [place], [place] [address]  Creatinine, serum     Status: Abnormal   Collection Time: 01/28/22  9:28 AM  Result Value Ref Range   Creatinine, Ser 0.40 (L) 0.44 - 1.00 mg/dL   GFR, Estimated >60 >60 mL/min    Comment:  (NOTE) Calculated using the CKD-EPI Creatinine Equation (2021) Performed at [facility], [address] [place], [place] [address]   CBC     Status: Abnormal   Collection Time: 01/29/22  6:20 AM  Result Value Ref Range   WBC 8.5 4.0 - 10.5 K/uL   RBC 3.38 (L) 3.87 - 5.11 MIL/uL   Hemoglobin 9.5 (L) 12.0 - 15.0 g/dL   HCT 29.5 (L) 36.0 - 46.0 %   MCV 87.3 80.0 - 100.0 fL   MCH 28.1 26.0 - 34.0 pg   MCHC 32.2 30.0 - 36.0 g/dL   RDW 13.6 11.5 - 15.5 %   Platelets 145 (L) 150 - 400 K/uL   nRBC 0.0 0.0 - 0.2 %    Comment: Performed at [facility], [address], [place], [place] [address]    Assessment:   31 y.o. [id number] postpartum day # 1  Plan:    1) Acute blood loss anemia - hemodynamically stable and asymptomatic - po ferrous sulfate  2) Blood Type --/--/B POS (10/22 0928) / Rubella 1.41 (03/27 1604) / Varicella Non-Immune  3) TDAP status  given antepartum  4) Feeding plan  : formula  5)  Education given regarding options for contraception, as well as compatibility with breast feeding if applicable.  Patient plans on  Mirena IUD  for contraception.  6) Disposition: continue current care (inadequate GBS treatment)   Tresea Mall, CNM  OB/GYN [facility] 01/29/2022, 10:49 AM

## 2022-01-30 MED ORDER — IBUPROFEN 600 MG PO TABS: 600.0000 mg | ORAL_TABLET | Freq: Four times a day (QID) | ORAL | 0 refills | Status: DC

## 2022-01-30 NOTE — Progress Notes (Signed)
Patient discharged home with family.  Discharge instructions, when to follow up, and prescriptions reviewed with patient.  Patient verbalized understanding. Patient transported to car for discharge with RN. Care relinquished.

## 2022-01-30 NOTE — Discharge Instructions (Addendum)
Discharge instructions:   Call office if you have any of the following:  headache, visual changes, fever >101.0 F, chills, breast concerns (engorgement, mastitis) excessive vaginal bleeding, incision drainage or problems, leg pain or redness, depression or any other concerns.   Wear supportive bra but do not bind breasts. Avoid breast/nipple stimulation. If milk transitions in, avoid applying heat or pumping. Apply cold compress or cold, washed cabbage leaves.   Activity: Do not lift > 10 lbs for 6 weeks.  No intercourse or tampons for 6 weeks.  No driving for 1-2 weeks or while taking pain medication. No strenuous activity or heavy lifting for 6 weeks.  No swimming pools, hot tubs or tub baths- showers only.    It is normal to bleed for up to 6 weeks. You should not soak through more than 1 pad in 1 hour.   Continue prenatal vitamin.   For concerns about your baby, please call your pediatrician For breastfeeding concerns, the lactation consultant can be reached at [phone]  Postpartum blues (feelings of happy one minute and sad another minute) are normal for the first few weeks but if it gets worse let your doctor know.

## 2022-02-02 ENCOUNTER — Encounter: Payer: Medicaid Other | Admitting: Obstetrics

## 2022-02-13 ENCOUNTER — Telehealth: Payer: Self-pay

## 2022-02-13 ENCOUNTER — Encounter: Payer: Self-pay | Admitting: Certified Nurse Midwife

## 2022-02-13 ENCOUNTER — Ambulatory Visit (INDEPENDENT_AMBULATORY_CARE_PROVIDER_SITE_OTHER): Payer: Medicaid Other | Admitting: Certified Nurse Midwife

## 2022-02-13 DIAGNOSIS — F53 Postpartum depression: Secondary | ICD-10-CM | POA: Diagnosis not present

## 2022-02-13 DIAGNOSIS — Z1331 Encounter for screening for depression: Secondary | ICD-10-CM

## 2022-02-13 DIAGNOSIS — Z1332 Encounter for screening for maternal depression: Secondary | ICD-10-CM

## 2022-02-13 MED ORDER — SERTRALINE HCL 50 MG PO TABS: 50.0000 mg | ORAL_TABLET | Freq: Every day | ORAL | 2 refills | Status: AC

## 2022-02-13 NOTE — Progress Notes (Signed)
Edinburgh Pp Depression Scale  Question 02/13/2022  2:51 PM EST - Filed by Patient  I have been able to laugh and see the funny side of things. Not at all  I have looked forward with enjoyment to things. Hardly at all  I have blamed myself unnecessarily when things went wrong. Yes, most of the time  I have been anxious or worried for no good reason. Yes, very often  I have felt scared or panicky for no good reason. Yes, quite a lot  Things have been getting on top of me. Yes, most of the time I haven't been able to cope at all  I have been so unhappy that I have had difficulty sleeping. Yes, most of the time  I have felt sad or miserable. Yes, most of the time  I have been so unhappy that I have been crying. Yes, most of the time  The thought of harming myself has occurred to me. Never

## 2022-02-13 NOTE — Progress Notes (Signed)
Virtual Visit via Telephone Note  I connected with Court Joy on 02/13/22 at  3:15 PM EST by telephone and verified that I am speaking with the correct person using two identifiers.  Location: Patient: at home Provider: at office   I discussed the limitations, risks, security and privacy concerns of performing an evaluation and management service by telephone and the availability of in person appointments. I also discussed with the patient that there may be a patient responsible charge related to this service. The patient expressed understanding and agreed to proceed.   History of Present Illness: Status post SVD x 2 wks.    Observations/Objective: Pt state she is not sleeping , has minimal support, is overwhelmed, crying a lot. She states she is having relationship issues with her partner. Her mother and sister are always busy, her other support system is not local so they are unable to help. She denies desire to hurt herself or her baby.   Assessment and Plan: EDPS: 27  Postpartum depression. Discussed use of medication and counseling. She is in agreement. Orders placed.   Follow Up Instructions: In 4 weeks for ppv in the office .    I discussed the assessment and treatment plan with the patient. The patient was provided an opportunity to ask questions and all were answered. The patient agreed with the plan and demonstrated an understanding of the instructions.   The patient was advised to call back or seek an in-person evaluation if the symptoms worsen or if the condition fails to improve as anticipated.  I provided 8 minutes of non-face-to-face time during this encounter.   Philip Aspen, CNM

## 2022-02-13 NOTE — Telephone Encounter (Signed)
Diana Kirby request that patient be contacted to schedule a 6 week post partum visit in 4 weeks. KW

## 2022-03-22 ENCOUNTER — Encounter: Payer: Self-pay | Admitting: Certified Nurse Midwife

## 2022-03-22 ENCOUNTER — Ambulatory Visit (INDEPENDENT_AMBULATORY_CARE_PROVIDER_SITE_OTHER): Payer: Medicaid Other | Admitting: Certified Nurse Midwife

## 2022-03-22 DIAGNOSIS — Z3202 Encounter for pregnancy test, result negative: Secondary | ICD-10-CM

## 2022-03-22 DIAGNOSIS — Z30430 Encounter for insertion of intrauterine contraceptive device: Secondary | ICD-10-CM

## 2022-03-22 LAB — POCT URINE PREGNANCY: Preg Test, Ur: NEGATIVE

## 2022-03-22 NOTE — Progress Notes (Signed)
Subjective:    Diana Kirby is a 31 y.o. [phone] African American female who presents for a postpartum visit. She is 6 weeks postpartum following a spontaneous vaginal delivery at term gestational weeks. Anesthesia: epidural. I have fully reviewed the prenatal and intrapartum course. Postpartum course has been normal. Baby's course has been normal. Baby is feeding by  bottle feeding . Bleeding no bleeding. Bowel function is normal. Bladder function is normal. Patient is not sexually active. Last sexual activity: prior to delivery. Contraception method is  IUD to be placed today  . Postpartum depression screening: negative. Score 3.  Last pap 08/04/20 and was negative .  The following portions of the patient's history were reviewed and updated as appropriate: allergies, current medications, past medical history, past surgical history and problem list.  Review of Systems Pertinent items are noted in HPI.   There were no vitals filed for this visit. Patient's last menstrual period was 04/28/2021 (exact date).  Objective:   General:  alert, cooperative and no distress   Breasts:  deferred, no complaints  Lungs: clear to auscultation bilaterally  Heart:  regular rate and rhythm  Abdomen: soft, nontender   Vulva: normal  Vagina: normal vagina  Cervix:  closed  Corpus: Well-involuted  Adnexa:  Non-palpable  Rectal Exam: no hemorrhoids        Assessment:   Postpartum exam 6 wks s/p SVD Bottle feeding Depression screening Contraception counseling   Plan:  : IUD Follow up in: 4 weeks for IUD string check or earlier if needed  Doreene Burke CNM

## 2022-03-22 NOTE — Patient Instructions (Signed)

## 2022-03-22 NOTE — Progress Notes (Signed)
   GYNECOLOGY OFFICE PROCEDURE NOTE  Diana Kirby is a 31 y.o. L2G4010 here for Mirena IUD insertion. No GYN concerns.  Last pap smear was on 08/04/2020 and was normal.  IUD Insertion Procedure Note Patient identified, informed consent performed, consent signed.   Discussed risks of irregular bleeding, cramping, infection, malpositioning or misplacement of the IUD outside the uterus which may require further procedure such as laparoscopy. Also discussed >99% contraception efficacy, increased risk of ectopic pregnancy with failure of method.   Emphasized that this did not protect against STIs, condoms recommended during all sexual encounters. Time out was performed.  Urine pregnancy test negative.  Speculum placed in the vagina.  Cervix visualized.  Cleaned with Betadine x 2.  Grasped anteriorly with a single tooth tenaculum.  Uterus sounded to 10 cm.  Mirena IUD placed per manufacturer's recommendations.  Strings trimmed to 3 cm. Tenaculum was removed, good hemostasis noted.  Patient tolerated procedure well.   Patient was given post-procedure instructions.  She was advised to have backup contraception for one week.  Patient was also asked to check IUD strings periodically and follow up in 4 weeks for IUD check.   Doreene Burke, CN M

## 2022-03-23 ENCOUNTER — Encounter: Payer: Self-pay | Admitting: Certified Nurse Midwife

## 2022-04-25 ENCOUNTER — Ambulatory Visit: Payer: Self-pay | Admitting: Psychiatry
